# Patient Record
Sex: Female | Born: 1950
Health system: Southern US, Community
[De-identification: ages and names within clinical notes are randomized; demographics above are authoritative.]

## PROBLEM LIST (undated history)

## (undated) DIAGNOSIS — I10 Essential (primary) hypertension: Secondary | ICD-10-CM

## (undated) DIAGNOSIS — R600 Localized edema: Secondary | ICD-10-CM

## (undated) DIAGNOSIS — C801 Malignant (primary) neoplasm, unspecified: Secondary | ICD-10-CM

## (undated) DIAGNOSIS — C541 Malignant neoplasm of endometrium: Secondary | ICD-10-CM

## (undated) DIAGNOSIS — Z8679 Personal history of other diseases of the circulatory system: Secondary | ICD-10-CM

## (undated) DIAGNOSIS — E039 Hypothyroidism, unspecified: Secondary | ICD-10-CM

## (undated) DIAGNOSIS — E7800 Pure hypercholesterolemia, unspecified: Secondary | ICD-10-CM

## (undated) DIAGNOSIS — E032 Hypothyroidism due to medicaments and other exogenous substances: Secondary | ICD-10-CM

## (undated) DIAGNOSIS — R002 Palpitations: Secondary | ICD-10-CM

## (undated) DIAGNOSIS — E119 Type 2 diabetes mellitus without complications: Secondary | ICD-10-CM

## (undated) DIAGNOSIS — E559 Vitamin D deficiency, unspecified: Secondary | ICD-10-CM

## (undated) DIAGNOSIS — E785 Hyperlipidemia, unspecified: Secondary | ICD-10-CM

## (undated) HISTORY — DX: Type 2 diabetes mellitus without complications: E11.9

## (undated) HISTORY — DX: Hyperlipidemia, unspecified: E78.5

## (undated) HISTORY — DX: Hypothyroidism, unspecified: E03.9

## (undated) HISTORY — DX: Malignant (primary) neoplasm, unspecified: C80.1

## (undated) HISTORY — DX: Essential (primary) hypertension: I10

## (undated) HISTORY — DX: Malignant neoplasm of endometrium: C54.1

## (undated) HISTORY — DX: Hypothyroidism due to medicaments and other exogenous substances: E03.2

## (undated) HISTORY — DX: Localized edema: R60.0

## (undated) HISTORY — DX: Palpitations: R00.2

## (undated) HISTORY — DX: Pure hypercholesterolemia, unspecified: E78.00

## (undated) HISTORY — DX: Vitamin D deficiency, unspecified: E55.9

## (undated) HISTORY — DX: Personal history of other diseases of the circulatory system: Z86.79

---

## 1987-01-31 HISTORY — PX: TUBAL LIGATION: SHX77

## 1997-07-21 ENCOUNTER — Ambulatory Visit (HOSPITAL_COMMUNITY): Admission: RE | Admit: 1997-07-21 | Discharge: 1997-07-21 | Payer: Self-pay | Admitting: Obstetrics and Gynecology

## 1998-07-26 ENCOUNTER — Ambulatory Visit (HOSPITAL_COMMUNITY): Admission: RE | Admit: 1998-07-26 | Discharge: 1998-07-26 | Payer: Self-pay | Admitting: Obstetrics and Gynecology

## 1999-01-31 HISTORY — PX: CARDIAC CATHETERIZATION: SHX172

## 1999-02-18 ENCOUNTER — Other Ambulatory Visit: Admission: RE | Admit: 1999-02-18 | Discharge: 1999-02-18 | Payer: Self-pay | Admitting: Obstetrics and Gynecology

## 1999-04-11 ENCOUNTER — Ambulatory Visit (HOSPITAL_COMMUNITY): Admission: RE | Admit: 1999-04-11 | Discharge: 1999-04-11 | Payer: Self-pay | Admitting: Cardiology

## 1999-08-18 ENCOUNTER — Other Ambulatory Visit: Admission: RE | Admit: 1999-08-18 | Discharge: 1999-08-18 | Payer: Self-pay | Admitting: Obstetrics and Gynecology

## 1999-11-03 ENCOUNTER — Ambulatory Visit (HOSPITAL_COMMUNITY): Admission: RE | Admit: 1999-11-03 | Discharge: 1999-11-03 | Payer: Self-pay | Admitting: Obstetrics and Gynecology

## 1999-11-03 ENCOUNTER — Encounter: Payer: Self-pay | Admitting: Obstetrics and Gynecology

## 2000-02-07 ENCOUNTER — Other Ambulatory Visit: Admission: RE | Admit: 2000-02-07 | Discharge: 2000-02-07 | Payer: Self-pay | Admitting: Obstetrics and Gynecology

## 2000-10-09 ENCOUNTER — Encounter: Payer: Self-pay | Admitting: *Deleted

## 2000-10-12 ENCOUNTER — Observation Stay (HOSPITAL_COMMUNITY): Admission: RE | Admit: 2000-10-12 | Discharge: 2000-10-13 | Payer: Self-pay | Admitting: *Deleted

## 2000-10-12 ENCOUNTER — Encounter (INDEPENDENT_AMBULATORY_CARE_PROVIDER_SITE_OTHER): Payer: Self-pay | Admitting: Specialist

## 2000-10-12 ENCOUNTER — Encounter: Payer: Self-pay | Admitting: *Deleted

## 2000-12-17 ENCOUNTER — Ambulatory Visit (HOSPITAL_COMMUNITY): Admission: RE | Admit: 2000-12-17 | Discharge: 2000-12-17 | Payer: Self-pay | Admitting: Obstetrics and Gynecology

## 2000-12-17 ENCOUNTER — Encounter: Payer: Self-pay | Admitting: Obstetrics and Gynecology

## 2001-01-30 HISTORY — PX: CHOLECYSTECTOMY: SHX55

## 2001-02-11 ENCOUNTER — Other Ambulatory Visit: Admission: RE | Admit: 2001-02-11 | Discharge: 2001-02-11 | Payer: Self-pay | Admitting: Obstetrics and Gynecology

## 2001-12-24 ENCOUNTER — Encounter: Payer: Self-pay | Admitting: Obstetrics and Gynecology

## 2001-12-24 ENCOUNTER — Ambulatory Visit (HOSPITAL_COMMUNITY): Admission: RE | Admit: 2001-12-24 | Discharge: 2001-12-24 | Payer: Self-pay | Admitting: Obstetrics and Gynecology

## 2002-04-01 ENCOUNTER — Other Ambulatory Visit: Admission: RE | Admit: 2002-04-01 | Discharge: 2002-04-01 | Payer: Self-pay | Admitting: Obstetrics and Gynecology

## 2005-04-30 HISTORY — PX: OTHER SURGICAL HISTORY: SHX169

## 2005-10-30 LAB — CONVERTED CEMR LAB

## 2006-01-18 ENCOUNTER — Ambulatory Visit: Payer: Self-pay | Admitting: Family Medicine

## 2006-01-18 DIAGNOSIS — Z6835 Body mass index (BMI) 35.0-35.9, adult: Secondary | ICD-10-CM | POA: Insufficient documentation

## 2006-01-18 DIAGNOSIS — Z8679 Personal history of other diseases of the circulatory system: Secondary | ICD-10-CM | POA: Insufficient documentation

## 2006-01-18 DIAGNOSIS — E559 Vitamin D deficiency, unspecified: Secondary | ICD-10-CM

## 2006-01-18 DIAGNOSIS — N926 Irregular menstruation, unspecified: Secondary | ICD-10-CM

## 2006-01-18 DIAGNOSIS — E032 Hypothyroidism due to medicaments and other exogenous substances: Secondary | ICD-10-CM

## 2006-01-18 DIAGNOSIS — E66811 Obesity, class 1: Secondary | ICD-10-CM | POA: Insufficient documentation

## 2006-01-18 DIAGNOSIS — E669 Obesity, unspecified: Secondary | ICD-10-CM

## 2006-01-18 DIAGNOSIS — E7800 Pure hypercholesterolemia, unspecified: Secondary | ICD-10-CM

## 2006-01-18 DIAGNOSIS — Z6834 Body mass index (BMI) 34.0-34.9, adult: Secondary | ICD-10-CM | POA: Insufficient documentation

## 2006-01-18 HISTORY — DX: Hypothyroidism due to medicaments and other exogenous substances: E03.2

## 2006-01-18 HISTORY — DX: Vitamin D deficiency, unspecified: E55.9

## 2006-01-18 HISTORY — DX: Pure hypercholesterolemia, unspecified: E78.00

## 2006-01-18 HISTORY — DX: Personal history of other diseases of the circulatory system: Z86.79

## 2006-02-21 ENCOUNTER — Encounter: Payer: Self-pay | Admitting: Family Medicine

## 2006-02-21 ENCOUNTER — Telehealth: Payer: Self-pay | Admitting: Family Medicine

## 2006-02-21 ENCOUNTER — Telehealth (INDEPENDENT_AMBULATORY_CARE_PROVIDER_SITE_OTHER): Payer: Self-pay | Admitting: *Deleted

## 2006-02-27 ENCOUNTER — Encounter: Payer: Self-pay | Admitting: Family Medicine

## 2006-02-27 ENCOUNTER — Telehealth (INDEPENDENT_AMBULATORY_CARE_PROVIDER_SITE_OTHER): Payer: Self-pay | Admitting: *Deleted

## 2006-03-07 ENCOUNTER — Ambulatory Visit: Payer: Self-pay | Admitting: Family Medicine

## 2006-03-07 DIAGNOSIS — I10 Essential (primary) hypertension: Secondary | ICD-10-CM

## 2006-05-10 ENCOUNTER — Telehealth: Payer: Self-pay | Admitting: Family Medicine

## 2006-06-06 ENCOUNTER — Ambulatory Visit: Payer: Self-pay | Admitting: Family Medicine

## 2006-06-12 ENCOUNTER — Encounter: Payer: Self-pay | Admitting: Family Medicine

## 2006-06-14 ENCOUNTER — Encounter: Payer: Self-pay | Admitting: Family Medicine

## 2006-06-20 LAB — CONVERTED CEMR LAB
TSH: 3.403 microintl units/mL (ref 0.350–5.50)
Vit D, 1,25-Dihydroxy: 24 (ref 20–57)

## 2006-06-29 ENCOUNTER — Telehealth: Payer: Self-pay | Admitting: Family Medicine

## 2006-08-07 ENCOUNTER — Telehealth: Payer: Self-pay | Admitting: Family Medicine

## 2007-01-01 ENCOUNTER — Ambulatory Visit: Payer: Self-pay | Admitting: Family Medicine

## 2007-01-02 LAB — CONVERTED CEMR LAB
ALT: 16 units/L (ref 0–35)
AST: 18 units/L (ref 0–37)
Albumin: 4.3 g/dL (ref 3.5–5.2)
Alkaline Phosphatase: 84 units/L (ref 39–117)
BUN: 14 mg/dL (ref 6–23)
CO2: 25 meq/L (ref 19–32)
Calcium: 9.4 mg/dL (ref 8.4–10.5)
Chloride: 103 meq/L (ref 96–112)
Cholesterol: 166 mg/dL (ref 0–200)
Creatinine, Ser: 0.65 mg/dL (ref 0.40–1.20)
Glucose, Bld: 111 mg/dL — ABNORMAL HIGH (ref 70–99)
HDL: 41 mg/dL (ref >39)
LDL Cholesterol: 94 mg/dL (ref 0–99)
Potassium: 4.6 meq/L (ref 3.5–5.3)
Sodium: 141 meq/L (ref 135–145)
TSH: 0.412 microintl units/mL (ref 0.350–5.50)
Total Bilirubin: 0.6 mg/dL (ref 0.3–1.2)
Total CHOL/HDL Ratio: 4
Total Protein: 7.4 g/dL (ref 6.0–8.3)
Triglycerides: 155 mg/dL — ABNORMAL HIGH (ref <150)
VLDL: 31 mg/dL (ref 0–40)
Vit D, 1,25-Dihydroxy: 32 (ref 30–89)

## 2007-05-28 ENCOUNTER — Other Ambulatory Visit: Admission: RE | Admit: 2007-05-28 | Discharge: 2007-05-28 | Payer: Self-pay | Admitting: Family Medicine

## 2007-05-28 ENCOUNTER — Encounter: Payer: Self-pay | Admitting: Family Medicine

## 2007-05-28 ENCOUNTER — Ambulatory Visit: Payer: Self-pay | Admitting: Family Medicine

## 2007-05-28 DIAGNOSIS — R7301 Impaired fasting glucose: Secondary | ICD-10-CM | POA: Insufficient documentation

## 2007-05-28 LAB — CONVERTED CEMR LAB
Glucose, Bld: 131 mg/dL
Hgb A1c MFr Bld: 5.8 %
Vit D, 1,25-Dihydroxy: 44 (ref 30–89)

## 2007-05-31 LAB — CONVERTED CEMR LAB: Pap Smear: NORMAL

## 2007-07-03 ENCOUNTER — Encounter: Payer: Self-pay | Admitting: Family Medicine

## 2008-02-25 ENCOUNTER — Telehealth: Payer: Self-pay | Admitting: Family Medicine

## 2008-06-25 ENCOUNTER — Ambulatory Visit: Payer: Self-pay | Admitting: Family Medicine

## 2008-09-17 ENCOUNTER — Encounter (INDEPENDENT_AMBULATORY_CARE_PROVIDER_SITE_OTHER): Payer: Self-pay | Admitting: *Deleted

## 2008-12-21 ENCOUNTER — Encounter: Payer: Self-pay | Admitting: Family Medicine

## 2009-01-07 ENCOUNTER — Ambulatory Visit: Payer: Self-pay | Admitting: Family Medicine

## 2009-01-07 DIAGNOSIS — J019 Acute sinusitis, unspecified: Secondary | ICD-10-CM

## 2009-01-15 ENCOUNTER — Telehealth: Payer: Self-pay | Admitting: Family Medicine

## 2009-09-06 ENCOUNTER — Telehealth: Payer: Self-pay | Admitting: Family Medicine

## 2009-09-16 ENCOUNTER — Ambulatory Visit: Payer: Self-pay | Admitting: Family Medicine

## 2009-09-16 ENCOUNTER — Encounter (INDEPENDENT_AMBULATORY_CARE_PROVIDER_SITE_OTHER): Payer: Self-pay | Admitting: *Deleted

## 2009-09-16 DIAGNOSIS — R002 Palpitations: Secondary | ICD-10-CM

## 2009-09-16 DIAGNOSIS — M25569 Pain in unspecified knee: Secondary | ICD-10-CM

## 2009-09-16 HISTORY — DX: Palpitations: R00.2

## 2009-10-08 ENCOUNTER — Encounter: Payer: Self-pay | Admitting: Family Medicine

## 2009-10-15 ENCOUNTER — Encounter: Payer: Self-pay | Admitting: Family Medicine

## 2009-10-18 ENCOUNTER — Telehealth (INDEPENDENT_AMBULATORY_CARE_PROVIDER_SITE_OTHER): Payer: Self-pay | Admitting: *Deleted

## 2009-11-04 ENCOUNTER — Encounter: Payer: Self-pay | Admitting: Family Medicine

## 2010-02-18 ENCOUNTER — Encounter: Payer: Self-pay | Admitting: Family Medicine

## 2010-02-18 ENCOUNTER — Telehealth (INDEPENDENT_AMBULATORY_CARE_PROVIDER_SITE_OTHER): Payer: Self-pay | Admitting: *Deleted

## 2010-02-18 LAB — CONVERTED CEMR LAB
Cholesterol: 179 mg/dL
LDL Cholesterol: 104 mg/dL
Triglyceride fasting, serum: 125 mg/dL

## 2010-02-21 ENCOUNTER — Encounter: Payer: Self-pay | Admitting: Family Medicine

## 2010-03-01 NOTE — Progress Notes (Signed)
----   Converted from flag ---- ---- 10/15/2009 5:17 PM, Nani Gasser MD wrote: Reviewed labs from  last year that were faxed over. They look great. No cholestesrol was checked adn the last one we checked was in 2008 so very overdue. Let recheck if OK. Can fax lab slip. ------------------------------ 10/18/2009- Pt notified of above. kJ LPN

## 2010-03-01 NOTE — Procedures (Signed)
Summary: [facility]   Imported By: Lanelle Bal 11/17/2009 14:27:09  _____________________________________________________________________  External Attachment:    Type:   Image     Comment:   External Document

## 2010-03-01 NOTE — Progress Notes (Signed)
Summary: refill on meds  Phone Note Call from Patient   Caller: Spouse Summary of Call: Dr.Jacelyn Cuen ***FYI****     Patient has an appt on 8-18 and she want to know if she can get a refill on her meds. Initial call taken by: Vanessa Swaziland,  September 06, 2009 3:49 PM  Follow-up for Phone Call        Already refilled the HCTZ. Pt notified Follow-up by: Kathlene November,  September 06, 2009 4:26 PM

## 2010-03-01 NOTE — Consult Note (Signed)
Summary: [facility]  [facility]   Imported By: Lanelle Bal 11/19/2009 15:32:23  _____________________________________________________________________  External Attachment:    Type:   Image     Comment:   External Document

## 2010-03-01 NOTE — Letter (Signed)
Summary: [facility]  [facility]   Imported By: Lanelle Bal 12/27/2009 08:54:34  _____________________________________________________________________  External Attachment:    Type:   Image     Comment:   External Document

## 2010-03-01 NOTE — Assessment & Plan Note (Signed)
Summary: HTN, palps, knee pain   Vital Signs:  Patient profile:   60 year old female Height:      67 inches Weight:      239 pounds Pulse rate:   61 / minute BP sitting:   116 / 67  (left arm) Cuff size:   large  Vitals Entered By: Avon Gully CMA, Duncan Dull) (September 16, 2009 10:57 AM) CC: f/u BP and refill meds,rt knee pain x several weeks   Primary Care Provider:  Linford Arnold, C  CC:  f/u BP and refill meds and rt knee pain x several weeks.  History of Present Illness: f/u BP and refill meds,rt knee pain x several weeks.  Right medial knee pain.  Pinaful with lifting leg and turning her leg. Hard to put he sock on.  Is up and down from her chair at her job. Burning stinging sensation. IBU does help some. STarted 3 weeks ago. No swelling. Not panful with walking.  No trauma.  No other ice or heat treatments.   Palpatation are more frequent. Occ intermittant nausea for minutes nda then resolve.  Has occ episodes of lightheadedness for a few seconds.  Last cardiac cath in 2001 that was normal. 30% blockage of the right coronary artery. Has never followed up with cardiology or had a stress test since them. She says the palps are not necessarily new but have become much more frequent. Teh nausea episdoes are new but not associ in timing with the palps.   Current Medications (verified): 1)  Levothroid 175 Mcg Tabs (Levothyroxine Sodium) .... Take 1 Tablet By Mouth Once A Day 2)  Hydrochlorothiazide 25 Mg Tabs (Hydrochlorothiazide) .... Take 1 Tablet By Mouth Once A Day 3)  Simvastatin 40 Mg Tabs (Simvastatin) .... Take 1 Tablet By Mouth Once A Day 4)  Toprol Xl 50 Mg Tb24 (Metoprolol Succinate) .... Take 1 Tablet By Mouth Once A Day 5)  Ecotrin Low Strength 81 Mg Tbec (Aspirin) .... Take 1 Tablet By Mouth Once A Day 6)  Augmentin 875-125 Mg Tabs (Amoxicillin-Pot Clavulanate) .... Take 1 Tablet By Mouth Two Times A Day For 10 Days. 7)  Fluconazole 150 Mg Tabs (Fluconazole) .Marland Kitchen.. 1 Tab By  Mouth X 1 Day  Allergies (verified): No Known Drug Allergies  Comments:  Nurse/Medical Assistant: The patient's medications and allergies were reviewed with the patient and were updated in the Medication and Allergy Lists. Avon Gully CMA, Duncan Dull) (September 16, 2009 10:58 AM)  Physical Exam  General:  Well-developed,well-nourished,in no acute distress; alert,appropriate and cooperative throughout examination Lungs:  Normal respiratory effort, chest expands symmetrically. Lungs are clear to auscultation, no crackles or wheezes. Heart:  RRR with 2/6 SEM better heard on on the right side of the chest. N carotid bruits.  Msk:  Right knee with no apparent swelling. Tender along the medial joint line/ No laxity of joint. Normal ROM. Neg Mcmurrays.  hip, knee, and ankle strength is 5/5.  Pulses:  Radial 2+    Impression & Recommendations:  Problem # 1:  HYPERTENSION, BENIGN ESSENTIAL (ICD-401.1) Looks great today. Continue current regimen.  Her updated medication list for this problem includes:    Hydrochlorothiazide 25 Mg Tabs (Hydrochlorothiazide) .Marland Kitchen... Take 1 tablet by mouth once a day    Toprol Xl 50 Mg Tb24 (Metoprolol succinate) .Marland Kitchen... Take 1 tablet by mouth once a day  BP today: 116/67 Prior BP: 141/71 (01/07/2009)  Prior 10 Yr Risk Heart Disease: 6 % (06/25/2008)  Labs Reviewed: K+: 4.6 (01/01/2007)  Creat: : 0.65 (01/01/2007)   Chol: 166 (01/01/2007)   HDL: 41 (01/01/2007)   LDL: 94 (01/01/2007)   TG: 155 (01/01/2007)  Problem # 2:  PALPITATIONS (ICD-785.1) Since palsp are more frequent and had some mild blockage 10 years ago will refer back to cards for further evaluation Pt prefer [facility] for insurance reasons.  Prefer [facility].  Her updated medication list for this problem includes:    Toprol Xl 50 Mg Tb24 (Metoprolol succinate) .Marland Kitchen... Take 1 tablet by mouth once a day  Orders: Cardiology Referral (Cardiology)  Problem # 3:  KNEE PAIN, RIGHT (EAV-409.81) Assessment:  New No trauma injury so fracture not likely.  REc change to Tylenol since has had gastric surgery. Elevate and rest. If nto better in 1-2 weeks willl get an xray and consider further workup. Consider meniscal tear vs medial compartment OA.  Her updated medication list for this problem includes:    Ecotrin Low Strength 81 Mg Tbec (Aspirin) .Marland Kitchen... Take 1 tablet by mouth once a day  Complete Medication List: 1)  Levothroid 175 Mcg Tabs (Levothyroxine sodium) .... Take 1 tablet by mouth once a day 2)  Hydrochlorothiazide 25 Mg Tabs (Hydrochlorothiazide) .... Take 1 tablet by mouth once a day 3)  Simvastatin 40 Mg Tabs (Simvastatin) .... Take 1 tablet by mouth once a day 4)  Toprol Xl 50 Mg Tb24 (Metoprolol succinate) .... Take 1 tablet by mouth once a day 5)  Ecotrin Low Strength 81 Mg Tbec (Aspirin) .... Take 1 tablet by mouth once a day 6)  Fish Oil 1000 Mg Caps (Omega-3 fatty acids) .... Take 1 tablet by mouth once a day  Other Orders: T-Lipid Profile ([id number])  Patient Instructions: 1)  We will call you with the cardiology appointment. 2)  Please schedule a follow-up appointment in 6 months .  Prescriptions: HYDROCHLOROTHIAZIDE 25 MG TABS (HYDROCHLOROTHIAZIDE) Take 1 tablet by mouth once a day  #90 x 3   Entered and Authorized by:   Nani Gasser MD   Signed by:   Nani Gasser MD on 09/16/2009   Method used:   Print then Give to Patient   RxID:   [id number]

## 2010-03-01 NOTE — Letter (Signed)
Summary: Primary Care Consult Scheduled Letter  [facility]  [address]   [place], [place] [address]   Phone: [phone]  Fax: [phone]      09/16/2009 MRN: [id number]  [facility] [address] [place], [place]  [address]    Dear [name],    We have scheduled an appointment for you.  At the recommendation of Dr.Metheney, we have scheduled you a consult with  [facility] on 10/06/09 at 1:15.  Their address is [address] [place], [place]. The office phone number is [phone].  If this appointment day and time is not convenient for you, please feel free to call the office of the doctor you are being referred to at the number listed above and reschedule the appointment.     It is important for you to keep your scheduled appointments. We are here to make sure you are given good patient care.    Thank you, Penny Bauer, [phone] Patient Care Coordinator [facility]

## 2010-03-03 NOTE — Progress Notes (Addendum)
Summary: Lab order  Phone Note Call from Patient Call back at Home Phone [phone]   Summary of Call: pt would like order for TSH faxed to [facility] in [place].  Pt states Dr. Linford Arnold gave her an order previously, but she lost it.  Pt will be going on Monday to have other routine labs (lipid) drawn. Initial call taken by: Francee Piccolo CMA Duncan Dull),  February 18, 2010 3:36 PM  Follow-up for Phone Call        Called and added to lab Follow-up by: Payton Spark CMA,  February 21, 2010 9:17 AM

## 2010-03-03 NOTE — Miscellaneous (Signed)
Summary: lipids  Clinical Lists Changes  Observations: Added new observation of TRIGLYCERIDE: 125 mg/dL (40/10/2723 3:66) Added new observation of HDL: 50 mg/dL (44/03/4740 5:95) Added new observation of LDL: 104 mg/dL (63/87/5643 3:29) Added new observation of CHOLESTEROL: 179 mg/dL (51/88/4166 0:63)   Call pt: LDL up some from last time. LDL is 104. Goal < 100. Work on diet, exercise to control this or we can increase the simvastatin but she is so close I think diet and exercise will breing it down 5 points Recheck direct LDL in 6 months. January 23, 20129:36 AM Ytzel Gubler MD, [facility]  10:58 AM February 21, 2010 McCrimmon CMA, Duncan Dull), Sue Lush pt's spouse notified     Lipid Panel Test Date: 02/18/2010                        Value        Units        H/L   Reference  Cholesterol:          179          mg/dL              (016-010) LDL Cholesterol:      104          mg/dL              (93-235) HDL Cholesterol:      50           mg/dL              (57-32) Triglyceride:         125          mg/dL              (20-254)

## 2010-03-04 ENCOUNTER — Telehealth: Payer: Self-pay | Admitting: Family Medicine

## 2010-03-09 ENCOUNTER — Encounter: Payer: Self-pay | Admitting: Family Medicine

## 2010-03-09 NOTE — Progress Notes (Signed)
Summary: increased thyroid dose  Phone Note Call from Patient   Summary of Call: TSH was TSH 23.400 on [facility] labs on 02-18-10.  Will increase her dose of thyroid medication and recheck TSH after 8 wks. Initial call taken by: Seymour Bars DO,  March 04, 2010 10:52 AM  Follow-up for Phone Call        pt notified of above.  She is agreeable.  Verbal script given to Onalee Hua at healthcare pharmacy.  Advised him to d/c script for 175 mcg Follow-up by: Francee Piccolo CMA Duncan Dull),  March 04, 2010 3:37 PM    New/Updated Medications: LEVOTHROID 200 MCG TABS (LEVOTHYROXINE SODIUM) 1 tab by mouth daily Prescriptions: LEVOTHROID 200 MCG TABS (LEVOTHYROXINE SODIUM) 1 tab by mouth daily  #30 x 1   Entered and Authorized by:   Seymour Bars DO   Signed by:   Seymour Bars DO on 03/04/2010   Method used:   Print then Give to Patient   RxID:   [id number]

## 2010-03-24 ENCOUNTER — Encounter: Payer: Self-pay | Admitting: Family Medicine

## 2010-03-29 ENCOUNTER — Encounter: Payer: Self-pay | Admitting: Family Medicine

## 2010-03-30 ENCOUNTER — Encounter: Payer: Self-pay | Admitting: Family Medicine

## 2010-04-07 NOTE — Procedures (Signed)
Summary: Colonoscopy    Preventive Care Screening  Colonoscopy:    Date:  03/29/2010    Next Due:  03/2015    Results:  abnormal

## 2010-04-18 ENCOUNTER — Telehealth: Payer: Self-pay | Admitting: Family Medicine

## 2010-04-19 NOTE — Letter (Signed)
Summary: Pathology Results Letter/[facility]  Pathology Results Letter/[facility]   Imported By: Maryln Gottron 04/12/2010 10:04:30  _____________________________________________________________________  External Attachment:    Type:   Image     Comment:   External Document

## 2010-04-19 NOTE — Procedures (Signed)
Summary: Colonoscopy Report/[facility]  Colonoscopy Report/[facility]   Imported By: Maryln Gottron 04/13/2010 09:52:04  _____________________________________________________________________  External Attachment:    Type:   Image     Comment:   External Document

## 2010-04-28 NOTE — Progress Notes (Signed)
  Phone Note Call from Patient   Caller: Patient Summary of Call: pt needs a lab order sent to her home address for TSH Initial call taken by: Avon Gully CMA, Duncan Dull),  April 18, 2010 9:22 AM

## 2010-05-19 ENCOUNTER — Telehealth: Payer: Self-pay | Admitting: *Deleted

## 2010-05-19 DIAGNOSIS — E039 Hypothyroidism, unspecified: Secondary | ICD-10-CM

## 2010-05-19 NOTE — Telephone Encounter (Signed)
Lab order and mailed to home

## 2010-05-24 LAB — TSH: TSH: 0.401

## 2010-06-08 ENCOUNTER — Encounter: Payer: Self-pay | Admitting: Family Medicine

## 2010-06-08 ENCOUNTER — Telehealth: Payer: Self-pay | Admitting: Family Medicine

## 2010-06-08 DIAGNOSIS — E039 Hypothyroidism, unspecified: Secondary | ICD-10-CM

## 2010-06-08 NOTE — Telephone Encounter (Signed)
Pt notified. Pt request that we mail her a lab order because she gets labs drawn in [place] [facility]

## 2010-06-08 NOTE — Telephone Encounter (Signed)
Call patient: TSH is actually a little too low on lab drawn at [facility]. I recommended she take a half of a tab 2 days a week. The other 5 days a week she can take a whole 200 mcg tab. Let's recheck the level in approximately 6 weeks on this new regimen.

## 2010-06-17 NOTE — Cardiovascular Report (Signed)
Penny Bauer. [facility]  Patient:    Penny Bauer, Penny Bauer                       MRN: [id number] Proc. Date: 04/11/99 Adm. Date:  [id number] Attending:  Corliss Marcus CC:         Marinda Elk, M.D.             Cardiac Catheterization Lab                        Cardiac Catheterization  CINE NO. [id number]  INDICATIONS:  This is a 60 year old woman with atypical angina and a nondiagnostic Cardiolite image.  She is brought to the catheterization laboratory to evaluate for possible CAD.  She has an exercise-induced tachy arrhythmia felt to represent atrial fibrillation and has been placed on beta blocker for this.  She is to undergo coronary angiography to rule out the possibility of CAD.  PROCEDURAL NOTE:  Left heart catheterization were performed following the percutaneous insertion of a 6-French catheter sheath utilizing an anterior approach over a guiding J wire.  The right groin had previously been prepped and draped n the usual sterile fashion.  Local anesthesia was obtained with the infiltration of 1% lidocaine.  A 5-French 110-cm pigtail catheter was advanced to the ascending  aorta where the pressure was recorded.  The catheter was then prolapsed across he aortic valve and the pressure was again recorded in the left ventricle both prior to and following the ventriculogram.  A 30-degree RAO cine left ventriculogram as performed using a power injector.  Nonionic contrast material of 45 cc was injected at 13 cc/second.  Right and left coronary angiography was then performed using right and left 5-French #4 Judkins catheters.  Cineangiography of each coronary  artery was conducted in LAO and RAO projections.  There was an ostial lesion noted in the right coronary, and the patient received 0.15 mg of intracoronary nitroglycerin and a subsequent LAO angiogram was repeated.  The patient did receive 1500 units of heparin following insertion of the arterial  sheath and 0.4 mg of nitroglycerin sublingual prior to initiation of the coronary angiography.  At the completion of the procedure, the catheter and catheter sheath were removed. Hemostasis was achieved by direct pressure.  The patient was transported to the  recovery area in stable condition with intact distal pulses.  Fluoroscopy time as 4.5 minutes.  Total contrast utilized was 200 cc of Omnipaque.  HEMODYNAMIC DATA:  Systemic arterial pressure was 168/96 with a mean of 117 mmHg. There was no systolic gradient across the aortic valve.  The left ventricular end diastolic pressure was 19 mmHg pre ventriculogram and unchanged post ventriculogram.  ANGIOGRAPHIC DATA:  The left ventriculogram demonstrated normal chamber size and overall normal global systolic function.  Unfortunately, ventricular tachycardia was produced throughout injection; therefore, no comment can be made about mitral regurgitation.  No coronary calcification is seen.  An estimate of the ejection fraction could not be made other than to say that it is normal.  There is a left dominant coronary system present. 1. The main left coronary artery is normal. 2. The left anterior descending artery and its branches have some luminal    irregularities in the proximal segment but not greater than 10-15% obstruction    of the lumen.  The ongoing vessel is large, transapical, and minimally diseased.    There are two moderate sized diagonal branches which arise  and are free of    significant obstruction. 3. The left circumflex artery and its branches are normal.  A very small first    marginal branch arises, then a large second marginal branch, and then two    posterolateral branches.  There are no significant obstructions in these    vessels. 4. The right coronary artery appeared to have a 30-40% ostial narrowing.  The    vessel is overall very small and gives rise to an acute marginal branch which    then proceeds  to supply some inferior septal perforators.  Following an    intracoronary injection of 0.4 nitroglycerin, the obstruction in the ostium    of the right coronary cannot be seen to be greater than 30%.  FINAL IMPRESSION: 1. Very mild nonobstructive atherosclerotic coronary vascular disease. 2. Intact left ventricular size and systolic function.  PLAN:  The patient is presented with this gratifying news.  I do not believe that her chest discomfort is secondary to obstructive coronary disease.  Because of the exercise-induced tachy arrhythmia, I will maintain her on a beta  blocker.  In fact, in the catheterization suite today, her initial central aortic pressure was elevated, and she was mildly tachycardic.  She did take her beta blocker this morning, Toprol 50 mg.  There is no clear history of hypertension.  I will repeat her exercise treadmill test for efficacy of the beta blocker in the near future.  I am increasing the dose from 50 to 100 mg of Toprol p.o. q. day. DD:  04/11/98 TD:  04/11/99 Job: [id number]

## 2010-07-01 ENCOUNTER — Other Ambulatory Visit: Payer: Self-pay | Admitting: Family Medicine

## 2010-08-04 ENCOUNTER — Ambulatory Visit (INDEPENDENT_AMBULATORY_CARE_PROVIDER_SITE_OTHER): Payer: Commercial Indemnity | Admitting: Family Medicine

## 2010-08-04 ENCOUNTER — Encounter: Payer: Self-pay | Admitting: Family Medicine

## 2010-08-04 DIAGNOSIS — E739 Lactose intolerance, unspecified: Secondary | ICD-10-CM

## 2010-08-04 DIAGNOSIS — R002 Palpitations: Secondary | ICD-10-CM

## 2010-08-04 DIAGNOSIS — R609 Edema, unspecified: Secondary | ICD-10-CM

## 2010-08-04 DIAGNOSIS — R600 Localized edema: Secondary | ICD-10-CM

## 2010-08-04 DIAGNOSIS — E032 Hypothyroidism due to medicaments and other exogenous substances: Secondary | ICD-10-CM

## 2010-08-04 DIAGNOSIS — Z23 Encounter for immunization: Secondary | ICD-10-CM

## 2010-08-04 HISTORY — DX: Localized edema: R60.0

## 2010-08-04 MED ORDER — LEVOTHYROXINE SODIUM 175 MCG PO TABS: 175.0000 ug | ORAL_TABLET | Freq: Every day | ORAL | Status: DC

## 2010-08-04 MED ORDER — HYDROCHLOROTHIAZIDE 25 MG PO TABS: 25.0000 mg | ORAL_TABLET | Freq: Every day | ORAL | Status: DC

## 2010-08-04 MED ORDER — SIMVASTATIN 40 MG PO TABS: 40.0000 mg | ORAL_TABLET | Freq: Every day | ORAL | Status: DC

## 2010-08-04 NOTE — Assessment & Plan Note (Signed)
Due for recheck glucose. CMP ordered today.

## 2010-08-04 NOTE — Assessment & Plan Note (Signed)
Well controlled on increased dose of beta blocker.

## 2010-08-04 NOTE — Assessment & Plan Note (Addendum)
CCCut dose in half since BP is borderline low, though she is asymptomatic.  Check CMP. If swelling recurs and she can go back to a whole tab.

## 2010-08-04 NOTE — Progress Notes (Signed)
  Subjective:    Patient ID: Penny Bauer, female    DOB: [birth year], 60 y.o.   MRN: [id number]  HPI  Penny Bauer to see cardiology last fall.  Saw Dr. Karren Burly.  Increased her betablocker to 100mg  and doing well on this. No more palps on the inc dose. No CP, SOB, etc.  No dizziness even thought BP borderline low today.  Complaint with meds.   Would like Thyroid dose adjusted back to to 175 as she had actually missed several doses when she was bumped to the synthroid dose and then when we rechecked her on this she was too low.   Review of Systems     Objective:   Physical Exam  Constitutional: She is oriented to person, place, and time. She appears well-developed and well-nourished.  Cardiovascular: Normal rate, regular rhythm and normal heart sounds.   Pulmonary/Chest: Effort normal and breath sounds normal.  Musculoskeletal: She exhibits no edema.  Neurological: She is alert and oriented to person, place, and time.  Skin: Skin is warm and dry.  Psychiatric: She has a normal mood and affect. Her behavior is normal.          Assessment & Plan:

## 2010-08-04 NOTE — Assessment & Plan Note (Signed)
Adjust dose back to and recheck in 2 months.

## 2010-08-04 NOTE — Assessment & Plan Note (Signed)
She doing on well on the inc dose o the toprol.

## 2010-08-04 NOTE — Patient Instructions (Signed)
Call in about 2 months to recheck thyroid.  Cut HCTZ in half if you can.

## 2010-08-05 ENCOUNTER — Other Ambulatory Visit: Payer: Self-pay | Admitting: Family Medicine

## 2011-01-19 ENCOUNTER — Telehealth: Payer: Self-pay | Admitting: Family Medicine

## 2011-01-19 NOTE — Telephone Encounter (Signed)
Please call patient. Normal mammogram.  Repeat in 1 year.  

## 2011-01-20 NOTE — Telephone Encounter (Signed)
Notified spouse  °

## 2011-01-26 ENCOUNTER — Encounter: Payer: Self-pay | Admitting: Family Medicine

## 2011-05-07 ENCOUNTER — Other Ambulatory Visit: Payer: Self-pay | Admitting: Family Medicine

## 2011-05-17 ENCOUNTER — Other Ambulatory Visit: Payer: Self-pay | Admitting: Family Medicine

## 2011-05-17 MED ORDER — METOPROLOL SUCCINATE ER 100 MG PO TB24: 100.0000 mg | ORAL_TABLET | Freq: Every day | ORAL | Status: DC

## 2011-06-16 ENCOUNTER — Encounter: Payer: Self-pay | Admitting: *Deleted

## 2011-06-19 ENCOUNTER — Encounter: Payer: Self-pay | Admitting: Family Medicine

## 2011-06-19 ENCOUNTER — Other Ambulatory Visit (HOSPITAL_COMMUNITY)
Admission: RE | Admit: 2011-06-19 | Discharge: 2011-06-19 | Disposition: A | Discharge status: Home / Self Care | Payer: BC Managed Care – PPO | Source: Ambulatory Visit | Attending: Family Medicine | Admitting: Family Medicine

## 2011-06-19 ENCOUNTER — Ambulatory Visit (INDEPENDENT_AMBULATORY_CARE_PROVIDER_SITE_OTHER): Payer: BC Managed Care – PPO | Admitting: Family Medicine

## 2011-06-19 VITALS — BP 119/68 | HR 76 | Ht 68.0 in | Wt 236.0 lb

## 2011-06-19 DIAGNOSIS — Z01419 Encounter for gynecological examination (general) (routine) without abnormal findings: Secondary | ICD-10-CM | POA: Insufficient documentation

## 2011-06-19 DIAGNOSIS — E039 Hypothyroidism, unspecified: Secondary | ICD-10-CM

## 2011-06-19 DIAGNOSIS — Z1159 Encounter for screening for other viral diseases: Secondary | ICD-10-CM | POA: Insufficient documentation

## 2011-06-19 MED ORDER — HYDROCHLOROTHIAZIDE 25 MG PO TABS: 25.0000 mg | ORAL_TABLET | Freq: Every day | ORAL | Status: DC

## 2011-06-19 MED ORDER — LEVOTHYROXINE SODIUM 175 MCG PO TABS: 175.0000 ug | ORAL_TABLET | Freq: Every day | ORAL | Status: DC

## 2011-06-19 MED ORDER — SIMVASTATIN 40 MG PO TABS: 40.0000 mg | ORAL_TABLET | Freq: Every day | ORAL | Status: DC

## 2011-06-19 MED ORDER — METOPROLOL SUCCINATE ER 100 MG PO TB24: 100.0000 mg | ORAL_TABLET | Freq: Every day | ORAL | Status: DC

## 2011-06-19 NOTE — Patient Instructions (Signed)
Start a regular exercise program and make sure you are eating a healthy diet Try to eat 4 servings of dairy a day  Your vaccines are up to date.   

## 2011-06-19 NOTE — Progress Notes (Signed)
  Subjective:     Penny Bauer is a 61 y.o. female and is here for a comprehensive physical exam. The patient reports no problems.  History   Social History  . Marital Status: Married    Spouse Name: Trey Paula    Number of Children: 3  . Years of Education: N/A   Occupational History  . RN     [employer]    Social History Main Topics  . Smoking status: Never Smoker   . Smokeless tobacco: Not on file  . Alcohol Use: No  . Drug Use: No  . Sexually Active: Not on file   Other Topics Concern  . Not on file   Social History Narrative   Her daughers are Raphael Gibney and Lurena Joiner Huminski. No regular exercise   Health Maintenance  Topic Date Due  . Pap Smear  05/14/1966  . Influenza Vaccine  10/31/2011  . Mammogram  01/16/2013  . Colonoscopy  03/30/2015  . Tetanus/tdap  10/01/2015  . Zostavax  Completed    The following portions of the patient's history were reviewed and updated as appropriate: allergies, current medications, past family history, past medical history, past social history, past surgical history and problem list.  Review of Systems A comprehensive review of systems was negative.   Objective:    BP 119/68  Pulse 76  Ht 5\' 8"  (1.727 m)  Wt 236 lb (107.049 kg)  BMI 35.88 kg/m2 General appearance: alert, cooperative, appears stated age and mild distress Head: Normocephalic, without obvious abnormality, atraumatic Eyes: conj clear, EOMi, PEERLA Ears: normal TM's and external ear canals both ears Nose: Nares normal. Septum midline. Mucosa normal. No drainage or sinus tenderness. Throat: lips, mucosa, and tongue normal; teeth and gums normal Neck: no adenopathy, no carotid bruit, no JVD, supple, symmetrical, trachea midline and thyroid not enlarged, symmetric, no tenderness/mass/nodules Back: symmetric, no curvature. ROM normal. No CVA tenderness. Lungs: clear to auscultation bilaterally Breasts: normal appearance, no masses or tenderness Heart:  regular rate and rhythm, S1, S2 normal, no murmur, click, rub or gallop Abdomen: soft, non-tender; bowel sounds normal; no masses,  no organomegaly Pelvic: cervix normal in appearance, external genitalia normal, no adnexal masses or tenderness, no cervical motion tenderness, rectovaginal septum normal, uterus normal size, shape, and consistency and vagina normal without discharge Extremities: extremities normal, atraumatic, no cyanosis or edema Pulses: 2+ and symmetric Skin: Skin color, texture, turgor normal. No rashes or lesions Lymph nodes: Cervical, supraclavicular, and axillary nodes normal. Neurologic: Grossly normal    Assessment:    Healthy female exam.      Plan:     See After Visit Summary for Counseling Recommendations  Start a regular exercise program and make sure you are eating a healthy diet Try to eat 4 servings of dairy a day or take a calcium supplement (500mg  twice a day). Your vaccines are up to date.  Due for screening labs.   Hypothyroid - recheck TSH today. RF sent to pharm.   BP - well controlled. RF sent to pharm  Lipids- Due to recheck. RF sent to pharm.

## 2011-06-30 ENCOUNTER — Other Ambulatory Visit: Payer: Self-pay | Admitting: Family Medicine

## 2011-08-08 ENCOUNTER — Encounter: Payer: Commercial Indemnity | Admitting: Family Medicine

## 2012-01-31 HISTORY — PX: ROBOTIC ASSISTED LAPAROSCOPIC HYSTERECTOMY AND SALPINGECTOMY: SHX6379

## 2012-03-12 ENCOUNTER — Encounter: Payer: Self-pay | Admitting: *Deleted

## 2012-04-29 ENCOUNTER — Other Ambulatory Visit: Payer: Self-pay | Admitting: Family Medicine

## 2012-06-13 ENCOUNTER — Other Ambulatory Visit: Payer: Self-pay | Admitting: Family Medicine

## 2012-06-26 ENCOUNTER — Other Ambulatory Visit: Payer: Self-pay | Admitting: Family Medicine

## 2012-06-26 MED ORDER — LEVOTHYROXINE SODIUM 175 MCG PO TABS: ORAL_TABLET | ORAL | Status: DC

## 2012-07-19 ENCOUNTER — Other Ambulatory Visit: Payer: Self-pay | Admitting: Family Medicine

## 2012-08-08 ENCOUNTER — Encounter: Payer: Self-pay | Admitting: Family Medicine

## 2012-08-08 ENCOUNTER — Other Ambulatory Visit (HOSPITAL_COMMUNITY)
Admission: RE | Admit: 2012-08-08 | Discharge: 2012-08-08 | Disposition: A | Discharge status: Home / Self Care | Payer: BC Managed Care – PPO | Source: Ambulatory Visit | Attending: Family Medicine | Admitting: Family Medicine

## 2012-08-08 ENCOUNTER — Ambulatory Visit (INDEPENDENT_AMBULATORY_CARE_PROVIDER_SITE_OTHER): Payer: BC Managed Care – PPO | Admitting: Family Medicine

## 2012-08-08 ENCOUNTER — Other Ambulatory Visit: Payer: Self-pay | Admitting: Family Medicine

## 2012-08-08 VITALS — BP 117/62 | HR 75 | Ht 67.0 in | Wt 247.0 lb

## 2012-08-08 DIAGNOSIS — N950 Postmenopausal bleeding: Secondary | ICD-10-CM

## 2012-08-08 DIAGNOSIS — N760 Acute vaginitis: Secondary | ICD-10-CM

## 2012-08-08 DIAGNOSIS — Z124 Encounter for screening for malignant neoplasm of cervix: Secondary | ICD-10-CM

## 2012-08-08 DIAGNOSIS — Z01419 Encounter for gynecological examination (general) (routine) without abnormal findings: Secondary | ICD-10-CM | POA: Insufficient documentation

## 2012-08-08 DIAGNOSIS — Z0000 Encounter for general adult medical examination without abnormal findings: Secondary | ICD-10-CM

## 2012-08-08 DIAGNOSIS — E039 Hypothyroidism, unspecified: Secondary | ICD-10-CM

## 2012-08-08 MED ORDER — SIMVASTATIN 40 MG PO TABS: 40.0000 mg | ORAL_TABLET | Freq: Every day | ORAL | Status: DC

## 2012-08-08 MED ORDER — LEVOTHYROXINE SODIUM 175 MCG PO TABS: ORAL_TABLET | ORAL | Status: DC

## 2012-08-08 MED ORDER — METOPROLOL TARTRATE 100 MG PO TABS: 100.0000 mg | ORAL_TABLET | Freq: Two times a day (BID) | ORAL | Status: DC

## 2012-08-08 MED ORDER — HYDROCHLOROTHIAZIDE 25 MG PO TABS: 25.0000 mg | ORAL_TABLET | Freq: Every day | ORAL | Status: DC

## 2012-08-08 NOTE — Patient Instructions (Addendum)
Keep up a regular exercise program and make sure you are eating a healthy diet Try to eat 4 servings of dairy a day, or if you are lactose intolerant take a calcium with vitamin D daily.  Your vaccines are up to date.   

## 2012-08-08 NOTE — Progress Notes (Signed)
Subjective:     Penny Bauer is a 62 y.o. female and is here for a comprehensive physical exam. The patient reports problems - noticed some vaginal spotting, brown d/c about 4-5 months ago.  Recently has been more red. almost seems cyclic like a period. Has noticed heavier d/c and odor latley.  Usually notices it in the afternoon. Fredna Dow will only for a day s or two.   History   Social History  . Marital Status: Married    Spouse Name: Trey Paula    Number of Children: 3  . Years of Education: N/A   Occupational History  . RN     [employer]    Social History Main Topics  . Smoking status: Never Smoker   . Smokeless tobacco: Not on file  . Alcohol Use: No  . Drug Use: No  . Sexually Active: Not on file   Other Topics Concern  . Not on file   Social History Narrative   Her daughers are Raphael Gibney and Lurena Joiner Huminski. No regular exercise   Health Maintenance  Topic Date Due  . Influenza Vaccine  09/30/2012  . Mammogram  03/07/2014  . Pap Smear  06/19/2014  . Colonoscopy  03/30/2015  . Tetanus/tdap  10/01/2015  . Zostavax  Completed    The following portions of the patient's history were reviewed and updated as appropriate: allergies, current medications, past family history, past medical history, past social history, past surgical history and problem list.  Review of Systems A comprehensive review of systems was negative.   Objective:    BP 117/62  Pulse 75  Ht 5\' 7"  (1.702 m)  Wt 247 lb (112.038 kg)  BMI 38.68 kg/m2 General appearance: alert, cooperative and appears stated age Head: Normocephalic, without obvious abnormality, atraumatic Eyes: conj clear, EOMi, PEERLA Ears: normal TM's and external ear canals both ears Nose: Nares normal. Septum midline. Mucosa normal. No drainage or sinus tenderness. Throat: lips, mucosa, and tongue normal; teeth and gums normal Neck: no adenopathy, no carotid bruit, no JVD, supple, symmetrical, trachea midline and  thyroid not enlarged, symmetric, no tenderness/mass/nodules Back: symmetric, no curvature. ROM normal. No CVA tenderness. Lungs: clear to auscultation bilaterally Breasts: not performed.  Heart: regular rate and rhythm, S1, S2 normal, no murmur, click, rub or gallop Abdomen: soft, non-tender; bowel sounds normal; no masses,  no organomegaly Pelvic: cervix normal in appearance, external genitalia normal, no adnexal masses or tenderness, no cervical motion tenderness, rectovaginal septum normal, uterus normal size, shape, and consistency and vaginal normal with thick yellow d/c at the os Extremities: extremities normal, atraumatic, no cyanosis or edema Pulses: 2+ and symmetric Skin: Skin color, texture, turgor normal. No rashes or lesions Lymph nodes: Cervical, supraclavicular, and axillary nodes normal. Neurologic: Alert and oriented X 3, normal strength and tone. Normal symmetric reflexes. Normal coordination and gait    Assessment:    Healthy female exam.      Plan:     See After Visit Summary for Counseling Recommendations  Keep up a regular exercise program and make sure you are eating a healthy diet Try to eat 4 servings of dairy a day, or if you are lactose intolerant take a calcium with vitamin D daily.  Your vaccines are up to date.   Postmenopausal bleeding-Pap smear performed today. No lesions in the vaginal canal. Cervix appears normal. She did have a thick yellow discharge and wet prep was collected and sent. We'll schedule her for a uterine ultrasound  and refer her to GYN for endometrial biopsy.  Hypogonadism-due for TSH. Also we have on her from [place] about 2 years ago. We have ordered labs on her twice since she did not go. She absolutely has to go today.  Palpitations-well-controlled on metoprolol. BX and release version is getting more expensive so she is willing to switch to the twice a day dosing for cost effectiveness.

## 2012-08-09 LAB — COMPLETE METABOLIC PANEL WITH GFR
Albumin: 4.1 g/dL (ref 3.5–5.2)
Alkaline Phosphatase: 83 U/L (ref 39–117)
BUN: 13 mg/dL (ref 6–23)
Creat: 0.66 mg/dL (ref 0.50–1.10)
GFR, Est Non African American: >89 mL/min
Glucose, Bld: 112 mg/dL — ABNORMAL HIGH (ref 70–99)
Total Bilirubin: 0.5 mg/dL (ref 0.3–1.2)

## 2012-08-09 LAB — LIPID PANEL
Cholesterol: 147 mg/dL (ref 0–200)
Total CHOL/HDL Ratio: 4.1 Ratio
Triglycerides: 106 mg/dL (ref <150)

## 2012-08-12 ENCOUNTER — Other Ambulatory Visit: Payer: Self-pay | Admitting: Family Medicine

## 2012-08-12 ENCOUNTER — Other Ambulatory Visit: Payer: Self-pay | Admitting: *Deleted

## 2012-08-12 DIAGNOSIS — E739 Lactose intolerance, unspecified: Secondary | ICD-10-CM

## 2012-08-12 DIAGNOSIS — E032 Hypothyroidism due to medicaments and other exogenous substances: Secondary | ICD-10-CM

## 2012-08-12 MED ORDER — LEVOTHYROXINE SODIUM 150 MCG PO TABS: ORAL_TABLET | ORAL | Status: DC

## 2012-08-19 ENCOUNTER — Telehealth: Payer: Self-pay | Admitting: Family Medicine

## 2012-08-19 NOTE — Telephone Encounter (Signed)
Pt informed told her that someone should be contacting her from their office to schedule.   Called women's health [phone] and lvm informing that referral was placed for pt on 7.10 and pt has yet to hear anything.Loralee Pacas [place]

## 2012-08-19 NOTE — Telephone Encounter (Signed)
Spoke with Archie Patten and she has an appt [phone].Heath Gold'

## 2012-08-19 NOTE — Telephone Encounter (Signed)
Please call patient. I did get a copy of her ultrasound report. She did have a thickened endometrium. This is abnormal in someone who is postmenopausal. She will definitely need endometrial biopsy. If she has not heard from the [facility] down the hall to schedule an appointment then please let me know. We did ask her for her information so they should have contacted her sometime last week. She also has a small uterine fibroid but this should not be causing any problems.

## 2012-08-27 ENCOUNTER — Encounter: Payer: Self-pay | Admitting: Obstetrics & Gynecology

## 2012-08-27 ENCOUNTER — Encounter: Payer: Self-pay | Admitting: *Deleted

## 2012-08-27 ENCOUNTER — Ambulatory Visit (INDEPENDENT_AMBULATORY_CARE_PROVIDER_SITE_OTHER): Payer: BC Managed Care – PPO | Admitting: Obstetrics & Gynecology

## 2012-08-27 DIAGNOSIS — N950 Postmenopausal bleeding: Secondary | ICD-10-CM

## 2012-08-27 NOTE — Progress Notes (Signed)
  Subjective:    Patient ID: Penny Bauer, female    DOB: 1951-01-30, 62 y.o.   MRN: [id number]  HPI  62 yo MW P3 [id number]) who is referred here by Dr. Linford Arnold for a 6 month h/o brownish vaginal discharge that has turned to PMB about 2 months ago. She is a [employer] fund[employer]. She had a pelvic u/s at [facility] about 2 weeks ago. She was told that there was "a thickening".  Review of Systems  Pap smear was normal 7/14.    Objective:   Physical Exam     Consent signed, time out done Cervix prepped with betadine and grasped with a single tooth tenaculum Uterus sounded to 8cm Pipelle used for 4 passes with a copious amount of tissue obtained. She tolerated the procedure well.      Assessment & Plan:

## 2012-09-03 ENCOUNTER — Ambulatory Visit (INDEPENDENT_AMBULATORY_CARE_PROVIDER_SITE_OTHER): Payer: BC Managed Care – PPO | Admitting: Obstetrics & Gynecology

## 2012-09-03 ENCOUNTER — Encounter: Payer: Self-pay | Admitting: Obstetrics & Gynecology

## 2012-09-03 DIAGNOSIS — C549 Malignant neoplasm of corpus uteri, unspecified: Secondary | ICD-10-CM

## 2012-09-03 DIAGNOSIS — C541 Malignant neoplasm of endometrium: Secondary | ICD-10-CM

## 2012-09-03 NOTE — Progress Notes (Signed)
  Subjective:    Patient ID: Penny Bauer, female    DOB: 06/06/1950, 62 y.o.   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 She prefers to have her treatment through [facility]. She has an appt 09-16-12.  Review of Systems     Objective:   Physical Exam        Assessment & Plan:  I will await to hear from the gyn onc to see if I can order and studies for them to help expedite her surgery.

## 2012-09-04 ENCOUNTER — Telehealth: Payer: Self-pay | Admitting: *Deleted

## 2012-09-04 NOTE — Telephone Encounter (Signed)
Lm message with pt's husband for appt with Dr Clifton James oncologist 09/05/12 @ 2:30 in [place].

## 2012-09-05 DIAGNOSIS — E039 Hypothyroidism, unspecified: Secondary | ICD-10-CM

## 2012-09-05 HISTORY — DX: Hypothyroidism, unspecified: E03.9

## 2012-09-06 ENCOUNTER — Encounter: Payer: Self-pay | Admitting: Family Medicine

## 2012-09-06 DIAGNOSIS — C541 Malignant neoplasm of endometrium: Secondary | ICD-10-CM

## 2012-09-06 DIAGNOSIS — Z8542 Personal history of malignant neoplasm of other parts of uterus: Secondary | ICD-10-CM | POA: Insufficient documentation

## 2012-09-06 HISTORY — DX: Malignant neoplasm of endometrium: C54.1

## 2012-09-12 ENCOUNTER — Ambulatory Visit: Payer: BC Managed Care – PPO | Admitting: Obstetrics & Gynecology

## 2012-11-21 ENCOUNTER — Encounter: Payer: Self-pay | Admitting: Family Medicine

## 2012-11-21 ENCOUNTER — Ambulatory Visit (INDEPENDENT_AMBULATORY_CARE_PROVIDER_SITE_OTHER): Payer: BC Managed Care – PPO | Admitting: Family Medicine

## 2012-11-21 VITALS — BP 117/65 | HR 65 | Wt 247.0 lb

## 2012-11-21 DIAGNOSIS — E739 Lactose intolerance, unspecified: Secondary | ICD-10-CM

## 2012-11-21 DIAGNOSIS — E032 Hypothyroidism due to medicaments and other exogenous substances: Secondary | ICD-10-CM

## 2012-11-21 DIAGNOSIS — D229 Melanocytic nevi, unspecified: Secondary | ICD-10-CM

## 2012-11-21 DIAGNOSIS — D239 Other benign neoplasm of skin, unspecified: Secondary | ICD-10-CM

## 2012-11-21 NOTE — Patient Instructions (Signed)
Return in 10 days for suture removal.

## 2012-11-21 NOTE — Progress Notes (Signed)
  Subjective:    Patient ID: Penny Bauer, female    DOB: May 23, 1950, 62 y.o.   MRN: [id number]  HPI    Review of Systems     Objective:   Physical Exam        Assessment & Plan:  Punch Biopsy Procedure Note  Pre-operative Diagnosis: Suspicious lesion  Post-operative Diagnosis: same  Locations:right mid back, lesion is 2 x 3 mm  Indications: discoloration  Anesthesia: Lidocaine 1% with epinephrine without added sodium bicarbonate  Procedure Details  History of allergy to iodine: no Patient informed of the risks (including bleeding and infection) and benefits of the  procedure and Verbal informed consent obtained.  The lesion and surrounding area was given a sterile prep using betadyne and draped in the usual sterile fashion. The skin was then stretched perpendicular to the skin tension lines and the lesion removed using the 4mm punch (full skin thickness punch bx). The resulting ellipse was then closed. The wound was closed with 4-0 Prolene using simple interrupted stitches. Antibiotic ointment and a sterile dressing applied. The specimen was sent for pathologic examination. The patient tolerated the procedure well.  EBL: 0.5 ml  Findings: Await pathology report  Condition: Stable  Complications: none.  Plan: 1. Instructed to keep the wound dry and covered for 24-48h and clean thereafter. 2. Warning signs of infection were reviewed.   3. Recommended that the patient use OTC acetaminophen as needed for pain.  4. Return for suture removal in 10 days

## 2012-11-22 LAB — HEMOGLOBIN A1C: Mean Plasma Glucose: 126 mg/dL — ABNORMAL HIGH (ref <117)

## 2012-12-02 ENCOUNTER — Ambulatory Visit: Payer: BC Managed Care – PPO

## 2013-01-16 ENCOUNTER — Ambulatory Visit (INDEPENDENT_AMBULATORY_CARE_PROVIDER_SITE_OTHER): Payer: BC Managed Care – PPO | Admitting: Family Medicine

## 2013-01-16 ENCOUNTER — Encounter: Payer: Self-pay | Admitting: Family Medicine

## 2013-01-16 ENCOUNTER — Telehealth: Payer: Self-pay | Admitting: *Deleted

## 2013-01-16 ENCOUNTER — Ambulatory Visit (INDEPENDENT_AMBULATORY_CARE_PROVIDER_SITE_OTHER): Payer: BC Managed Care – PPO

## 2013-01-16 VITALS — BP 114/63 | HR 83 | Temp 98.6°F | Ht 67.0 in | Wt 247.0 lb

## 2013-01-16 DIAGNOSIS — R935 Abnormal findings on diagnostic imaging of other abdominal regions, including retroperitoneum: Secondary | ICD-10-CM

## 2013-01-16 DIAGNOSIS — R1031 Right lower quadrant pain: Secondary | ICD-10-CM

## 2013-01-16 DIAGNOSIS — N739 Female pelvic inflammatory disease, unspecified: Secondary | ICD-10-CM

## 2013-01-16 DIAGNOSIS — R509 Fever, unspecified: Secondary | ICD-10-CM

## 2013-01-16 DIAGNOSIS — R188 Other ascites: Secondary | ICD-10-CM | POA: Insufficient documentation

## 2013-01-16 LAB — POCT URINALYSIS DIPSTICK
Bilirubin, UA: NEGATIVE
Glucose, UA: NEGATIVE
Ketones, UA: NEGATIVE
Leukocytes, UA: NEGATIVE
Nitrite, UA: NEGATIVE
Spec Grav, UA: 1.015
pH, UA: 7.5

## 2013-01-16 MED ORDER — DOXYCYCLINE HYCLATE 100 MG PO TABS: 100.0000 mg | ORAL_TABLET | Freq: Two times a day (BID) | ORAL | Status: DC

## 2013-01-16 MED ORDER — IOHEXOL 300 MG/ML  SOLN
100.0000 mL | Freq: Once | INTRAMUSCULAR | Status: AC | PRN
  Administered 2013-01-16: 100 mL via INTRAVENOUS

## 2013-01-16 MED ORDER — TRAMADOL HCL 50 MG PO TABS: 50.0000 mg | ORAL_TABLET | Freq: Three times a day (TID) | ORAL | Status: DC | PRN

## 2013-01-16 MED ORDER — METRONIDAZOLE 500 MG PO TABS: 500.0000 mg | ORAL_TABLET | Freq: Two times a day (BID) | ORAL | Status: DC

## 2013-01-16 NOTE — Telephone Encounter (Signed)
Pt is scheduled to see Gyn Onc provider on 01/17/13 at 9:15am in [facility] location. We are sending written CT report and CD with pt. Pt informed.  Meyer Cory, LPN

## 2013-01-16 NOTE — Telephone Encounter (Signed)
PA obtained for CT ABD/Pelvis w/contrast. Auth # [id number].  Meyer Cory, LPN

## 2013-01-16 NOTE — Progress Notes (Signed)
Subjective:    Patient ID: Penny Bauer, female    DOB: 09-27-1950, 62 y.o.   MRN: [id number]  HPI R-sided lower abd pain - x 4-5 days. Worse with positions. Worse with lying down. Has been sleeping in chair bc  Her husbnad has been hospital. Woke her up with pain.  Has subsided some as the day has progress.  Fever to 101 last week before the sxs started but not other URI sxs.  No nausea or change in bowel.  No blood in stool or urine.  No dysuria.  Pain does radiate towards the groin.  No hx of renal stones. Better with rest. Walking doesn't make it worse. Had complete hysterectomy over the summer.    Review of Systems    Hx of cholecystectomy/complete hysterectomy.   BP 114/63  Pulse 83  Temp(Src) 98.6 F (37 C)  Ht 5\' 7"  (1.702 m)  Wt 247 lb (112.038 kg)  BMI 38.68 kg/m2    No Known Allergies  Past Medical History  Diagnosis Date  . Hypertension   . Hyperlipidemia   . Adenocarcinoma     Past Surgical History  Procedure Laterality Date  . Tubal ligation  1989  . Lap-band surgery  April 2007  . Cholecystectomy  2003  . Cardiac catheterization  2001    30% blockage    History   Social History  . Marital Status: Married    Spouse Name: Trey Paula    Number of Children: 3  . Years of Education: N/A   Occupational History  . RN     [employer]    Social History Main Topics  . Smoking status: Never Smoker   . Smokeless tobacco: Not on file  . Alcohol Use: No  . Drug Use: No  . Sexual Activity: Not on file   Other Topics Concern  . Not on file   Social History Narrative   Her daughers are Raphael Gibney and Lurena Joiner Huminski. No regular exercise    Family History  Problem Relation Age of Onset  . Pancreatic cancer Mother   . Throat cancer Brother   . Heart attack Father 19  . Hypertension Father   . Stroke Father   . Depression Daughter   . Depression Mother   . Diabetes      Grandfather  . Liver disease Sister     Fatty liver     Outpatient Encounter Prescriptions as of 01/16/2013  Medication Sig  . aspirin 81 MG tablet Take 81 mg by mouth daily.    . fish oil-omega-3 fatty acids 1000 MG capsule Take 2 g by mouth daily.    . hydrochlorothiazide (HYDRODIURIL) 25 MG tablet Take 1 tablet (25 mg total) by mouth daily.  Marland Kitchen levothyroxine (SYNTHROID, LEVOTHROID) 150 MCG tablet TAKE ONE TABLET BY MOUTH ONE TIME DAILY  . metoprolol (LOPRESSOR) 100 MG tablet Take 1 tablet (100 mg total) by mouth 2 (two) times daily.  . simvastatin (ZOCOR) 40 MG tablet Take 1 tablet (40 mg total) by mouth at bedtime.       Objective:   Physical Exam  Constitutional: She is oriented to person, place, and time. She appears well-developed and well-nourished.  HENT:  Head: Normocephalic and atraumatic.  Cardiovascular: Normal rate, regular rhythm and normal heart sounds.   Pulmonary/Chest: Effort normal and breath sounds normal.  Abdominal: Soft. Bowel sounds are normal. She exhibits no distension and no mass. There is tenderness. There is no rebound and no guarding.  Exquisitely tender in the RLQ with some guarding  Neurological: She is alert and oriented to person, place, and time.  Skin: Skin is warm and dry.  Psychiatric: She has a normal mood and affect. Her behavior is normal.          Assessment & Plan:  RLQ pain - am very concerned about the possibility of appendicitis. She had some low-grade temps last week without any other significant symptoms such as cold or respiratory infection. And now she's having significant right lower quadrant pain. At its worst is an 8/10. With palpation at an 8/10. At rest it subsides to about 3/10. Fortunately she has not noticed any blood in the stool or nausea. Will get CT of the abdomen and pelvis with contrast to rule out appendicitis. Review results with patient.  CT scan showed abscess in the right lower Kocher. Approximately 4 x 5 cm. It seems to have a wall around it. Tumor is not  completely ruled out. She did have a complete hysterectomy in the summer for endometrial cancer. We [facility] to see if they could get her in today or early tomorrow for surgical consult. Because of a hysterectomy they recommended that she see her gynecologist oncologist to make sure that it is not a complication related to the surgery that she had the summer. They're able to see her first thing in the morning but it as that we go ahead and place her on antibiotic. She was given 250 mg of ceftriaxone. We'll also send her a prescription for metronidazole 5 her milligrams twice a day x14 days with doxycycline 100 mg twice a day x14 days. If she gets worse overnight, pain increases, starts to run a fever or vomit in the emergency department immediately. Right now her pain level is about a 2-3/10. Will send over tramadol.

## 2013-01-17 ENCOUNTER — Encounter: Payer: Self-pay | Admitting: Family Medicine

## 2013-01-17 DIAGNOSIS — N731 Chronic parametritis and pelvic cellulitis: Secondary | ICD-10-CM

## 2013-01-17 DIAGNOSIS — E119 Type 2 diabetes mellitus without complications: Secondary | ICD-10-CM

## 2013-01-17 HISTORY — DX: Type 2 diabetes mellitus without complications: E11.9

## 2013-01-17 MED ORDER — CEFTRIAXONE SODIUM 250 MG IJ SOLR
250.0000 mg | Freq: Once | INTRAMUSCULAR | Status: AC
  Administered 2013-01-17: 250 mg via INTRAMUSCULAR

## 2013-01-17 NOTE — Addendum Note (Signed)
Addended by: Deno Etienne on: 01/17/2013 05:06 PM   Modules accepted: Orders

## 2013-01-20 ENCOUNTER — Telehealth: Payer: Self-pay | Admitting: *Deleted

## 2013-01-20 NOTE — Telephone Encounter (Signed)
Pt calls office and LM on [place] VM stating that she wanted you to know that she did see Dr. Clifton James and went over the CT results and she said it was a Lymphocele that they will sometimes see after uterine surgery and she switched her antibiotics to Cipro.  Told her this should help her feel better but if not the next step would be to put a drain in.  Pt states she is feeling much better.  FYI

## 2013-03-31 ENCOUNTER — Other Ambulatory Visit: Payer: Self-pay | Admitting: Family Medicine

## 2013-04-15 ENCOUNTER — Encounter: Payer: Self-pay | Admitting: *Deleted

## 2013-05-01 ENCOUNTER — Ambulatory Visit (INDEPENDENT_AMBULATORY_CARE_PROVIDER_SITE_OTHER): Payer: Commercial Managed Care - HMO | Admitting: Family Medicine

## 2013-05-01 ENCOUNTER — Encounter: Payer: Self-pay | Admitting: Family Medicine

## 2013-05-01 VITALS — BP 123/64 | HR 69 | Wt 247.0 lb

## 2013-05-01 DIAGNOSIS — L989 Disorder of the skin and subcutaneous tissue, unspecified: Secondary | ICD-10-CM

## 2013-05-01 DIAGNOSIS — C541 Malignant neoplasm of endometrium: Secondary | ICD-10-CM

## 2013-05-01 MED ORDER — METOPROLOL TARTRATE 100 MG PO TABS: 100.0000 mg | ORAL_TABLET | Freq: Two times a day (BID) | ORAL | Status: DC

## 2013-05-01 MED ORDER — HYDROCHLOROTHIAZIDE 25 MG PO TABS: 25.0000 mg | ORAL_TABLET | Freq: Every day | ORAL | Status: DC

## 2013-05-01 MED ORDER — LEVOTHYROXINE SODIUM 150 MCG PO TABS: ORAL_TABLET | ORAL | Status: DC

## 2013-05-01 NOTE — Progress Notes (Signed)
   Subjective:    Patient ID: Penny Bauer, female    DOB: 02/27/1950, 63 y.o.   MRN: [id number]  HPI she has a lesion on her upper anterior chest and her neck. It's not painful or itchy or irritating but has noticed that it's been slowly getting larger and is concerned. She did have an atypical nevus on her back back in the fall. She does have a history of endometrial cancer but no skin cancer. She noticed the lesion initially 3 weeks ago and was much smaller at that time.   Review of Systems     Objective:   Physical Exam  She has a pink round macular lesion with irregular borders on her upper chest near the sternoclavicular notch. Approximately 0.9 x 1 cm in size. It is slightly raised. It has scale on the lower half and is more shiny on the upper half. Able to visualize blood vessels in the lesion itself.      Assessment & Plan:  Suspicious skin lesion-it does have a somewhat shiny appearance consistent with basal cell but the lower end of the lesion is more consistent with a seborrheic keratosis but does have increased vascularization. Small blood vessels are apparent on exam. Shave biopsy performed. Please see note below for procedure.  Shave Biopsy Procedure Note  Pre-operative Diagnosis: Suspicious lesion  Post-operative Diagnosis: same  Locations:upper mid chest near the sternoclavicular notch  Indications: getting larger.   Anesthesia: Lidocaine 1% with epinephrine without added sodium bicarbonate  Procedure Details  History of allergy to iodine: no  Patient informed of the risks (including bleeding and infection) and benefits of the  procedure and Verbal informed consent obtained.  The lesion and surrounding area were given a sterile prep using betadyne and draped in the usual sterile fashion. A scalpel was used to shave an area of skin approximately 1.5cm by 1.5cm.  Hemostasis achieved with alumuninum chloride. Antibiotic ointment and a sterile dressing applied.   The specimen was sent for pathologic examination. The patient tolerated the procedure well.  EBL: 0 ml  Findings: Await pathology   Condition: Stable  Complications: none.  Plan: 1. Instructed to keep the wound dry and covered for 24-48h and clean thereafter. 2. Warning signs of infection were reviewed.   3. Recommended that the patient use OTC acetaminophen as needed for pain.  4. Return prn

## 2013-05-06 ENCOUNTER — Other Ambulatory Visit: Payer: Self-pay | Admitting: *Deleted

## 2013-05-06 MED ORDER — METOPROLOL TARTRATE 100 MG PO TABS: 100.0000 mg | ORAL_TABLET | Freq: Two times a day (BID) | ORAL | Status: DC

## 2013-05-06 MED ORDER — HYDROCHLOROTHIAZIDE 25 MG PO TABS: 25.0000 mg | ORAL_TABLET | Freq: Every day | ORAL | Status: DC

## 2013-08-09 ENCOUNTER — Other Ambulatory Visit: Payer: Self-pay | Admitting: Family Medicine

## 2013-08-12 ENCOUNTER — Telehealth: Payer: Self-pay | Admitting: Family Medicine

## 2013-08-12 NOTE — Telephone Encounter (Signed)
Needs to schedule f/u for her prediabetes, thyroid, etc.

## 2013-08-14 NOTE — Telephone Encounter (Signed)
Left msg vm to call to schedule appt.

## 2013-09-02 ENCOUNTER — Telehealth: Payer: Self-pay | Admitting: *Deleted

## 2013-09-02 DIAGNOSIS — C549 Malignant neoplasm of corpus uteri, unspecified: Secondary | ICD-10-CM

## 2013-09-02 NOTE — Telephone Encounter (Signed)
Pt called about possibly needing a referral for [facility] in [facility] for family risk screening for cancer. I informed her that this should be initiated by her oncologist however, if needed we will place the referral. She has an appt on 09/05/13 @ 130. I told her to check with the oncologist and get back with me to let me know if we indeed need to place the referral for her.Penny Bauer [name]

## 2013-09-02 NOTE — Addendum Note (Signed)
Addended by: Teddy Spike on: 09/02/2013 04:48 PM   Modules accepted: Orders

## 2013-09-02 NOTE — Telephone Encounter (Signed)
Referral placed.Penny Bauer  

## 2013-09-08 ENCOUNTER — Ambulatory Visit: Payer: Commercial Managed Care - HMO | Admitting: Family Medicine

## 2013-10-16 ENCOUNTER — Encounter: Payer: Self-pay | Admitting: Family Medicine

## 2013-10-16 ENCOUNTER — Ambulatory Visit (INDEPENDENT_AMBULATORY_CARE_PROVIDER_SITE_OTHER): Payer: Commercial Managed Care - HMO | Admitting: Family Medicine

## 2013-10-16 VITALS — BP 112/65 | HR 66 | Temp 98.6°F | Ht 66.0 in | Wt 253.0 lb

## 2013-10-16 DIAGNOSIS — E739 Lactose intolerance, unspecified: Secondary | ICD-10-CM

## 2013-10-16 DIAGNOSIS — Z23 Encounter for immunization: Secondary | ICD-10-CM

## 2013-10-16 DIAGNOSIS — R600 Localized edema: Secondary | ICD-10-CM

## 2013-10-16 DIAGNOSIS — E032 Hypothyroidism due to medicaments and other exogenous substances: Secondary | ICD-10-CM

## 2013-10-16 DIAGNOSIS — Z8679 Personal history of other diseases of the circulatory system: Secondary | ICD-10-CM

## 2013-10-16 DIAGNOSIS — R609 Edema, unspecified: Secondary | ICD-10-CM

## 2013-10-16 LAB — POCT GLYCOSYLATED HEMOGLOBIN (HGB A1C): Hemoglobin A1C: 6.2

## 2013-10-16 MED ORDER — LEVOTHYROXINE SODIUM 150 MCG PO TABS: ORAL_TABLET | ORAL | Status: DC

## 2013-10-16 MED ORDER — METOPROLOL TARTRATE 100 MG PO TABS: 100.0000 mg | ORAL_TABLET | Freq: Two times a day (BID) | ORAL | Status: DC

## 2013-10-16 MED ORDER — SIMVASTATIN 40 MG PO TABS: ORAL_TABLET | ORAL | Status: DC

## 2013-10-16 MED ORDER — HYDROCHLOROTHIAZIDE 25 MG PO TABS: 25.0000 mg | ORAL_TABLET | Freq: Every day | ORAL | Status: DC

## 2013-10-16 NOTE — Assessment & Plan Note (Addendum)
11 A1c is up today at 6.2 from previous. We discussed the importance of getting back on tract with diet and exercise. F/U in 6 months.

## 2013-10-16 NOTE — Assessment & Plan Note (Signed)
ON metoprolol.  Well controlled.

## 2013-10-16 NOTE — Progress Notes (Signed)
   Subjective:    Patient ID: Penny Bauer, female    DOB: [birth year], 63 y.o.   MRN: [id number]  HPI IFG  - no hypoglycemic events. No wounds or sores that are not healing well. No increased thirst or urination.has been stress eating but is worried that her A1c will be elevated today. Lab Results  Component Value Date   HGBA1C 6.0* 11/21/2012    Palpitations and edema- Pt denies chest pain, SOB, dizziness, or heart palpitations.  Taking meds as directed w/o problems.  Denies medication side effects.    hyperlipdemia - Due to recheck lipids. Tolerating statin well.   Hypothyroidism-no recent changes in skin or hair. She has been stressed. No recent weight changes. She takes her medication regularly. She feels that the medications working well. Review of Systems     Objective:   Physical Exam  Constitutional: She is oriented to person, place, and time. She appears well-developed and well-nourished.  HENT:  Head: Normocephalic and atraumatic.  Cardiovascular: Normal rate, regular rhythm and normal heart sounds.   Pulmonary/Chest: Effort normal and breath sounds normal.  Neurological: She is alert and oriented to person, place, and time.  Skin: Skin is warm and dry.  Psychiatric: She has a normal mood and affect. Her behavior is normal.          Assessment & Plan:  Flu shot given today.   Discussed need for Prevnar.

## 2013-10-16 NOTE — Assessment & Plan Note (Signed)
On hctz for this(not for blood pressure).

## 2013-10-16 NOTE — Assessment & Plan Note (Signed)
Due to recheck TSH. 

## 2013-11-06 ENCOUNTER — Telehealth: Payer: Self-pay | Admitting: *Deleted

## 2013-11-06 DIAGNOSIS — C541 Malignant neoplasm of endometrium: Secondary | ICD-10-CM

## 2013-11-06 NOTE — Telephone Encounter (Signed)
Referral placed.Penny Bauer  

## 2013-12-11 LAB — LIPID PANEL
CHOL/HDL RATIO: 3.5 ratio
Cholesterol: 143 mg/dL (ref 0–200)
HDL: 41 mg/dL (ref >39)
LDL CALC: 77 mg/dL (ref 0–99)
TRIGLYCERIDES: 126 mg/dL (ref <150)
VLDL: 25 mg/dL (ref 0–40)

## 2013-12-11 LAB — COMPLETE METABOLIC PANEL WITH GFR
ALT: 14 U/L (ref 0–35)
AST: 12 U/L (ref 0–37)
Albumin: 3.8 g/dL (ref 3.5–5.2)
Alkaline Phosphatase: 95 U/L (ref 39–117)
BUN: 12 mg/dL (ref 6–23)
CO2: 29 meq/L (ref 19–32)
Calcium: 9.6 mg/dL (ref 8.4–10.5)
Chloride: 102 mEq/L (ref 96–112)
Creat: 0.66 mg/dL (ref 0.50–1.10)
GFR, Est Non African American: >89 mL/min
Glucose, Bld: 117 mg/dL — ABNORMAL HIGH (ref 70–99)
Potassium: 4.1 mEq/L (ref 3.5–5.3)
SODIUM: 140 meq/L (ref 135–145)
TOTAL PROTEIN: 7.3 g/dL (ref 6.0–8.3)
Total Bilirubin: 0.6 mg/dL (ref 0.2–1.2)

## 2013-12-11 LAB — TSH: TSH: 0.984 u[IU]/mL (ref 0.350–4.500)

## 2013-12-14 NOTE — Progress Notes (Signed)
Quick Note:  All labs are normal. ______ 

## 2014-02-03 IMAGING — CT CT ABD-PELV W/ CM
1 series · 15 of 32 positions shown, 19 images · IV contrast (omnipaque)
Comparison: None.

CLINICAL DATA: Right lower quadrant pain.  Fever.

EXAM:
CT ABDOMEN AND PELVIS WITH CONTRAST
TECHNIQUE: Multidetector CT imaging of the abdomen and pelvis was performed
using the standard protocol following bolus administration of
intravenous contrast.
CONTRAST:  100mL OMNIPAQUE IOHEXOL 300 MG/ML  SOLN

[Series 2: abd/pelvis with · axial · 0.78mm/px · z∈[-475,-75]mm · 15 of 89 slices shown, 19 images]
[im 6/89  soft-tissue]
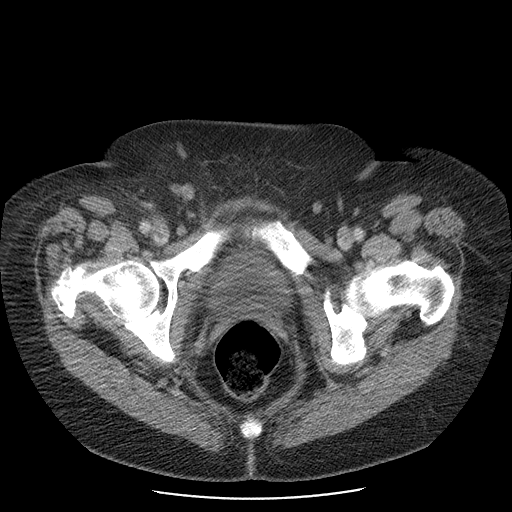
[im 6/89  bone]
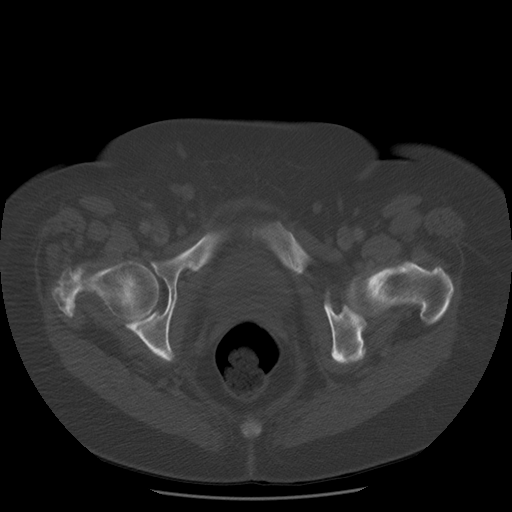
[im 12/89  soft-tissue]
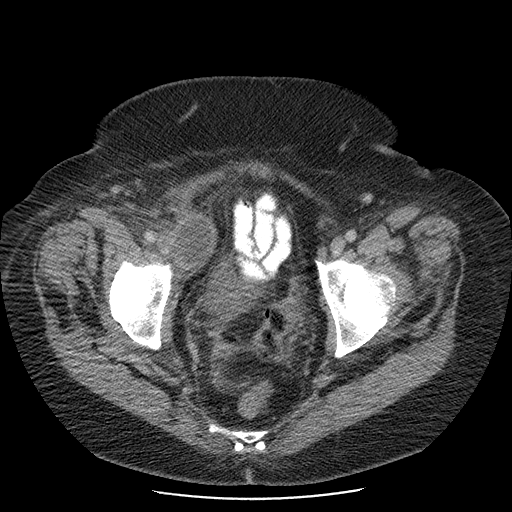
[im 18/89  soft-tissue]
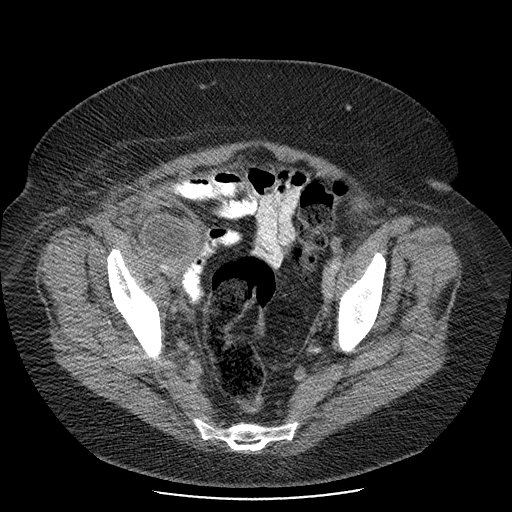
[im 26/89  soft-tissue]
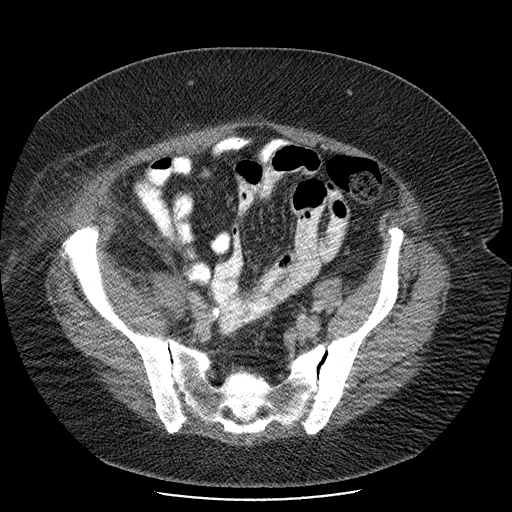
[im 32/89  soft-tissue]
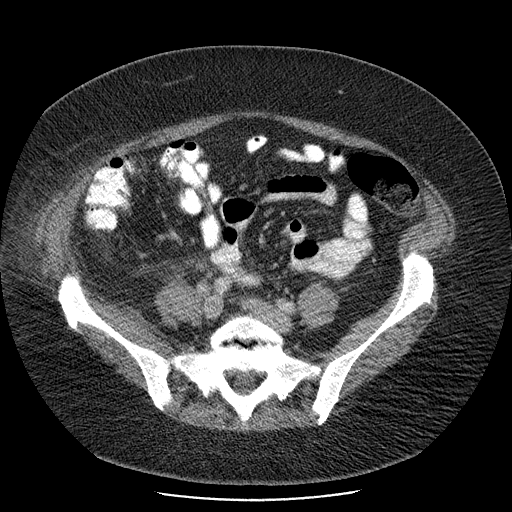
[im 37/89  soft-tissue]
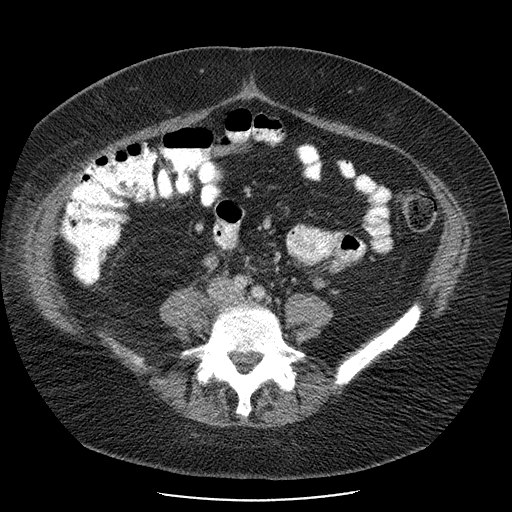
[im 46/89  soft-tissue]
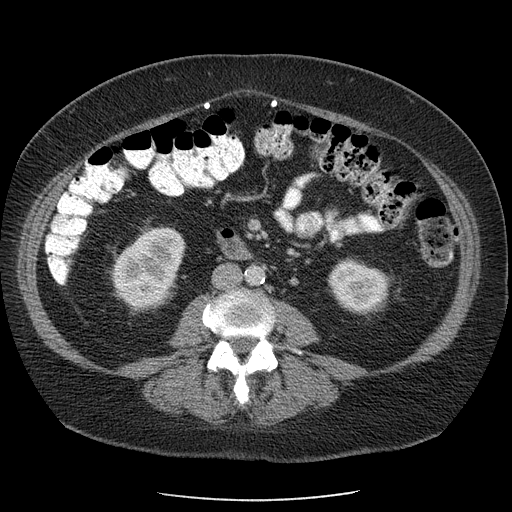
[im 52/89  soft-tissue]
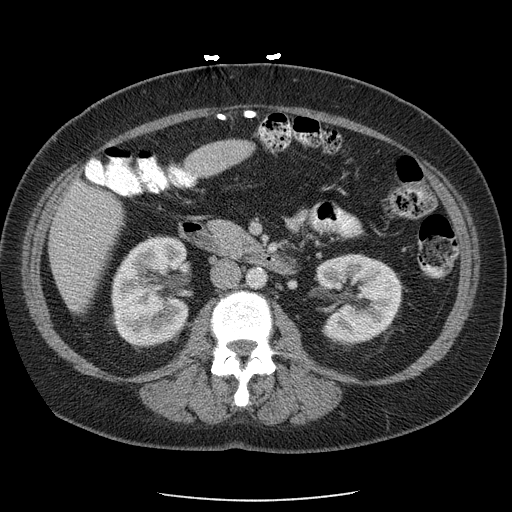
[im 57/89  soft-tissue]
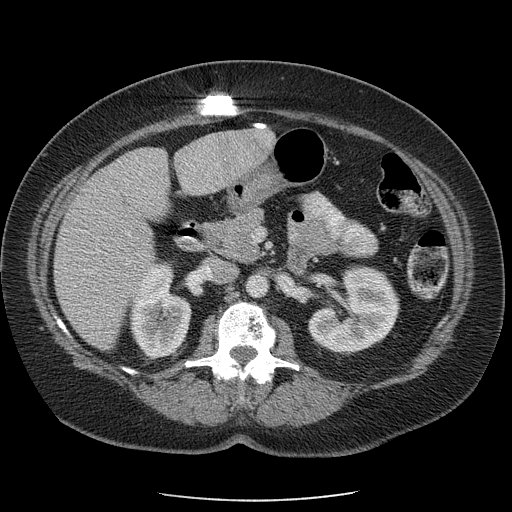
[im 57/89  bone]
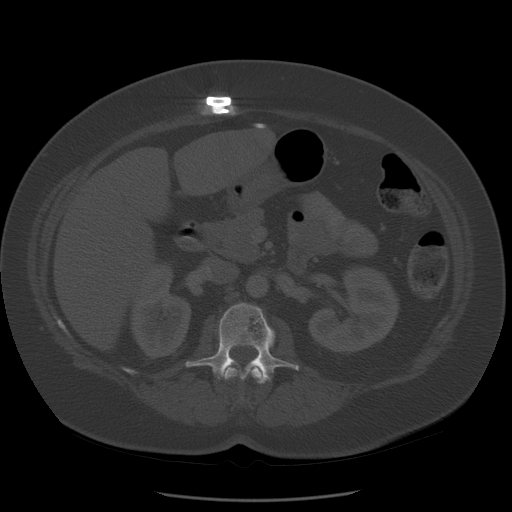
[im 63/89  soft-tissue]
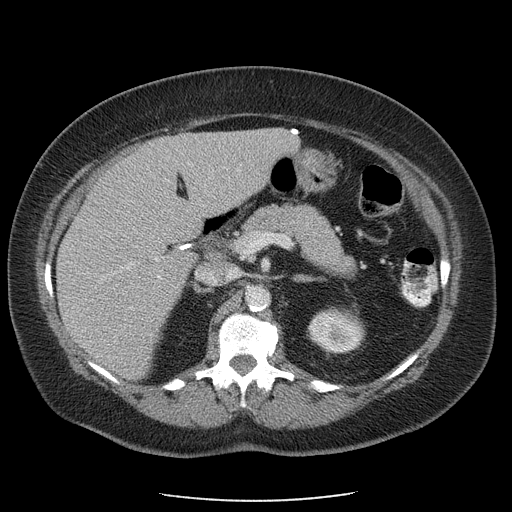
[im 71/89  soft-tissue]
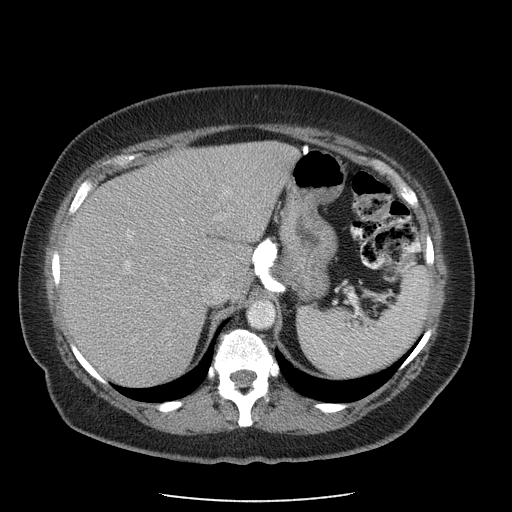
[im 77/89  soft-tissue]
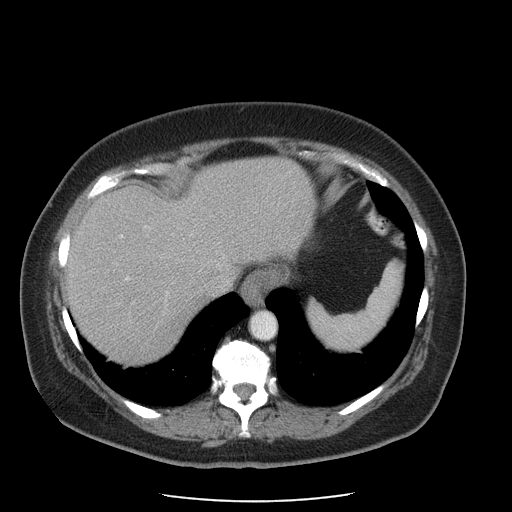
[im 77/89  lung]
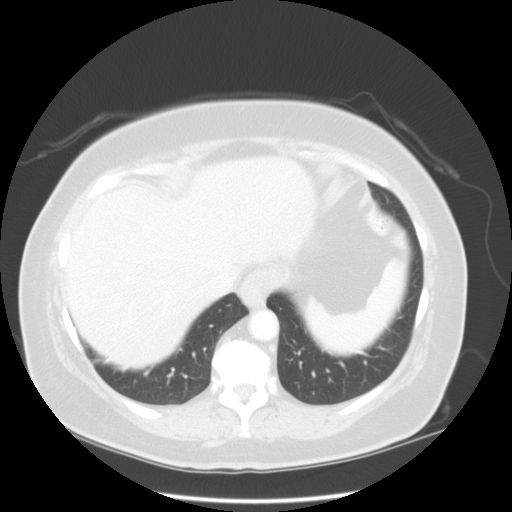
[im 80/89  lung]
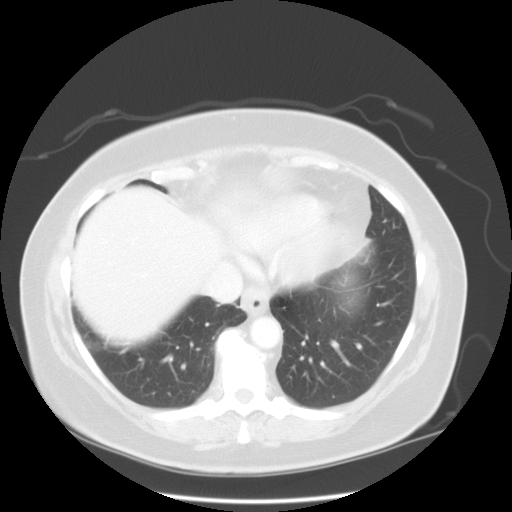
[im 83/89  soft-tissue]
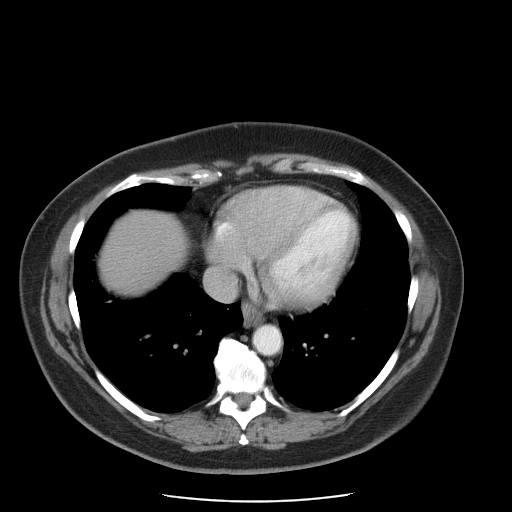
[im 83/89  lung]
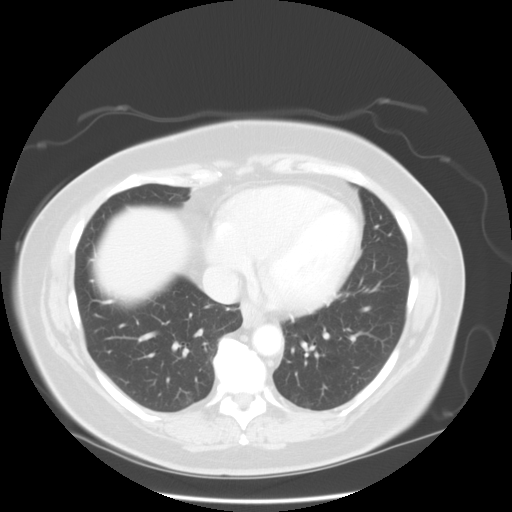
[im 86/89  lung]
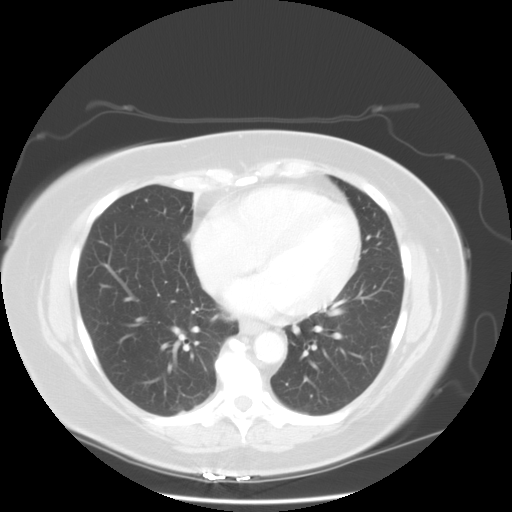

[15 of 32 positions shown; findings below may reference images not displayed]

FINDINGS: Liver normal. Spleen normal. Stenosis. Pancreas normal.
Cholecystectomy. No biliary distention. Portal vein and splenic vein
patent.

Adrenals normal. No focal renal abnormality. No hydronephrosis. No
evidence of obstructing ureteral stone. Bladder is nondistended.
Phleboliths. Hysterectomy.

Multiple small right inguinal and right iliac lymph nodes are
present. Shotty retroperitoneal lymph nodes are present. Aortoiliac
and visceral vascular calcification present consistent with
atherosclerotic vascular disease. No aneurysm.

The appendix is normal. There is no evidence of bowel distention.
Stool is present in the colon. There is no evidence of gastric
distention. Small sliding hiatal hernia is present. A gastric band
is present. Noted in the right lower quadrant, representative image
number 73/series 2, is defined fluid collection with slightly
thickened wall and adjacent fat plane thickening measuring 4.9 x
cm. Given the patient's symptoms this may represent an abscess. This
could be of gynecologic/ovarian origin and other etiologies of
adnexal masses including tumor cannot be excluded. Small amount of
free pelvic fluid is present. No free air.

Lung bases are clear. Tiny umbilical hernia is present with
herniation of fat only. Abdominal wall is otherwise intact. Mild
right lower quadrant abdominal wall edema is present adjacent to the
abdominal/pelvic process. Degenerative changes thoracolumbar spine.
No acute bony abnormality.
IMPRESSION: 4.9 x 4.2 right lower pelvic fluid collection with thickened wall.
Adjacent inguinal and iliac lymph nodes are present. Mild lower
pelvic wall edema is present. This could represent an abscess
possibly of gynecologic origin. The possibility of malignancy cannot
be excluded. The appendix is normal.

## 2014-02-26 DIAGNOSIS — C541 Malignant neoplasm of endometrium: Secondary | ICD-10-CM | POA: Diagnosis not present

## 2014-02-26 DIAGNOSIS — Z90710 Acquired absence of both cervix and uterus: Secondary | ICD-10-CM | POA: Diagnosis not present

## 2014-02-26 DIAGNOSIS — Z90722 Acquired absence of ovaries, bilateral: Secondary | ICD-10-CM | POA: Diagnosis not present

## 2014-02-26 DIAGNOSIS — Z08 Encounter for follow-up examination after completed treatment for malignant neoplasm: Secondary | ICD-10-CM | POA: Diagnosis not present

## 2014-02-26 DIAGNOSIS — E780 Pure hypercholesterolemia: Secondary | ICD-10-CM | POA: Diagnosis not present

## 2014-02-26 DIAGNOSIS — Z9049 Acquired absence of other specified parts of digestive tract: Secondary | ICD-10-CM | POA: Diagnosis not present

## 2014-02-26 DIAGNOSIS — E039 Hypothyroidism, unspecified: Secondary | ICD-10-CM | POA: Diagnosis not present

## 2014-02-26 DIAGNOSIS — Z9079 Acquired absence of other genital organ(s): Secondary | ICD-10-CM | POA: Diagnosis not present

## 2014-02-26 DIAGNOSIS — Z8619 Personal history of other infectious and parasitic diseases: Secondary | ICD-10-CM | POA: Diagnosis not present

## 2014-02-26 DIAGNOSIS — Z8542 Personal history of malignant neoplasm of other parts of uterus: Secondary | ICD-10-CM | POA: Diagnosis not present

## 2014-04-07 ENCOUNTER — Other Ambulatory Visit: Payer: Self-pay | Admitting: Family Medicine

## 2014-04-15 DIAGNOSIS — Z1231 Encounter for screening mammogram for malignant neoplasm of breast: Secondary | ICD-10-CM | POA: Diagnosis not present

## 2014-04-15 LAB — HM MAMMOGRAPHY

## 2014-04-16 ENCOUNTER — Ambulatory Visit: Payer: Commercial Managed Care - HMO | Admitting: Family Medicine

## 2014-04-30 ENCOUNTER — Encounter: Payer: Self-pay | Admitting: Family Medicine

## 2014-05-12 ENCOUNTER — Ambulatory Visit (INDEPENDENT_AMBULATORY_CARE_PROVIDER_SITE_OTHER): Payer: Commercial Managed Care - HMO | Admitting: Family Medicine

## 2014-05-12 ENCOUNTER — Encounter: Payer: Self-pay | Admitting: Family Medicine

## 2014-05-12 VITALS — BP 116/67 | HR 87 | Ht 66.0 in | Wt 257.0 lb

## 2014-05-12 DIAGNOSIS — N951 Menopausal and female climacteric states: Secondary | ICD-10-CM

## 2014-05-12 DIAGNOSIS — E739 Lactose intolerance, unspecified: Secondary | ICD-10-CM | POA: Diagnosis not present

## 2014-05-12 DIAGNOSIS — M25511 Pain in right shoulder: Secondary | ICD-10-CM | POA: Diagnosis not present

## 2014-05-12 DIAGNOSIS — R002 Palpitations: Secondary | ICD-10-CM

## 2014-05-12 LAB — POCT GLYCOSYLATED HEMOGLOBIN (HGB A1C): Hemoglobin A1C: 6.1

## 2014-05-12 MED ORDER — METOPROLOL TARTRATE 100 MG PO TABS: 100.0000 mg | ORAL_TABLET | Freq: Two times a day (BID) | ORAL | Status: DC

## 2014-05-12 MED ORDER — HYDROCHLOROTHIAZIDE 25 MG PO TABS: 25.0000 mg | ORAL_TABLET | Freq: Every day | ORAL | Status: DC

## 2014-05-12 NOTE — Progress Notes (Signed)
   Subjective:    Patient ID: Penny Bauer, female    DOB: 31-Jul-1950, 64 y.o.   MRN: [id number]  HPI Palpitations - on metoprolol and doing well. No CP or SOB.    IfG - no inc thirst or urination. Doing well. Has been trying to start exercising. Plans on staring Silver Sneakers.  Hasn't been able to exercise much as has been taking care of her husband who has been dealing with a diabetic foot ulcer.     right shoulder pain since September. It started right after she had to immunizations in that shoulder. Ever since then she's had some discomfort and some tingling that radiates down her fingertips. She denies any neck pain or trauma. She denies any old injuries to the shoulder or clavicle. It does not wake her up at night. She is able to sleep on that shoulder. It really bothers her if she rests her elbow on the armrest periods and she gets pain over the deltoid.   Review of Systems     Objective:   Physical Exam  Constitutional: She is oriented to person, place, and time. She appears well-developed and well-nourished.  HENT:  Head: Normocephalic and atraumatic.  Cardiovascular: Normal rate, regular rhythm and normal heart sounds.   No carotid bruit   Pulmonary/Chest: Effort normal and breath sounds normal.  Neurological: She is alert and oriented to person, place, and time.  Skin: Skin is warm and dry.  Psychiatric: She has a normal mood and affect. Her behavior is normal.       Assessment & Plan:  Palpitations - RF metoprolol.  F/U in 6 months.   IFG - A1C is 6.1 today. F/U in 6 months.   Right shoulder pain- normal range of motion. Good strength. Positive impingement test. Will get x-ray today for further evaluation. Suspect subacromial bursitis.  Rotator cuff seems in good shape.

## 2014-05-13 ENCOUNTER — Ambulatory Visit: Payer: Commercial Managed Care - HMO | Admitting: Family Medicine

## 2014-05-13 LAB — BASIC METABOLIC PANEL
BUN: 13 mg/dL (ref 6–23)
CHLORIDE: 104 meq/L (ref 96–112)
CO2: 29 mEq/L (ref 19–32)
Calcium: 9.5 mg/dL (ref 8.4–10.5)
Creat: 0.65 mg/dL (ref 0.50–1.10)
Glucose, Bld: 94 mg/dL (ref 70–99)
POTASSIUM: 4.1 meq/L (ref 3.5–5.3)
SODIUM: 141 meq/L (ref 135–145)

## 2014-05-13 NOTE — Progress Notes (Signed)
Quick Note:  All labs are normal. ______ 

## 2014-05-29 ENCOUNTER — Other Ambulatory Visit: Payer: Self-pay | Admitting: *Deleted

## 2014-05-29 DIAGNOSIS — C541 Malignant neoplasm of endometrium: Secondary | ICD-10-CM

## 2014-06-01 ENCOUNTER — Other Ambulatory Visit: Payer: Self-pay | Admitting: *Deleted

## 2014-06-04 DIAGNOSIS — Z6841 Body Mass Index (BMI) 40.0 and over, adult: Secondary | ICD-10-CM | POA: Diagnosis not present

## 2014-06-04 DIAGNOSIS — C541 Malignant neoplasm of endometrium: Secondary | ICD-10-CM | POA: Diagnosis not present

## 2014-06-04 DIAGNOSIS — Z8542 Personal history of malignant neoplasm of other parts of uterus: Secondary | ICD-10-CM | POA: Diagnosis not present

## 2014-10-15 ENCOUNTER — Ambulatory Visit (INDEPENDENT_AMBULATORY_CARE_PROVIDER_SITE_OTHER): Payer: Commercial Managed Care - HMO | Admitting: Osteopathic Medicine

## 2014-10-15 VITALS — BP 134/70 | HR 70 | Temp 98.2°F | Ht 66.0 in | Wt 257.0 lb

## 2014-10-15 DIAGNOSIS — M546 Pain in thoracic spine: Secondary | ICD-10-CM | POA: Diagnosis not present

## 2014-10-15 DIAGNOSIS — M7918 Myalgia, other site: Secondary | ICD-10-CM

## 2014-10-15 DIAGNOSIS — R109 Unspecified abdominal pain: Secondary | ICD-10-CM

## 2014-10-15 LAB — POCT URINALYSIS DIPSTICK
BILIRUBIN UA: NEGATIVE
Blood, UA: NEGATIVE
GLUCOSE UA: NEGATIVE
KETONES UA: NEGATIVE
LEUKOCYTES UA: NEGATIVE
Nitrite, UA: NEGATIVE
Protein, UA: NEGATIVE
Spec Grav, UA: 1.015
Urobilinogen, UA: 0.2
pH, UA: 7

## 2014-10-15 MED ORDER — MELOXICAM 7.5 MG PO TABS: 7.5000 mg | ORAL_TABLET | Freq: Two times a day (BID) | ORAL | Status: DC | PRN

## 2014-10-15 MED ORDER — CYCLOBENZAPRINE HCL 5 MG PO TABS: 5.0000 mg | ORAL_TABLET | Freq: Three times a day (TID) | ORAL | Status: DC | PRN

## 2014-10-15 NOTE — Patient Instructions (Signed)

## 2014-10-15 NOTE — Progress Notes (Signed)
HPI: Penny Bauer is a 64 y.o. female who presents to [facility]  today for chief complaint of:  Chief Complaint  Patient presents with  . right flank pain concern for kidney stone    . Location: R flank . Quality: cramping, sharp pain . Duration: 3 days . Timing:intermittent . Context: woke up with the pain a few days ago, owrried she slept wrong/ No hx stones . Modifying factors: twisting motion makes it worse . Assoc signs/symptoms: no urinary problems   Past medical, social and family history reviewed: Past Medical History  Diagnosis Date  . Hypertension   . Hyperlipidemia   . Adenocarcinoma   . Type II or unspecified type diabetes mellitus without mention of complication, not stated as uncontrolled 01/17/2013   Past Surgical History  Procedure Laterality Date  . Tubal ligation  1989  . Lap-band surgery  April 2007  . Cholecystectomy  2003  . Cardiac catheterization  2001    30% blockage  . Robotic assisted laparoscopic hysterectomy and salpingectomy  2014   Social History  Substance Use Topics  . Smoking status: Never Smoker   . Smokeless tobacco: Not on file  . Alcohol Use: No   Family History  Problem Relation Age of Onset  . Pancreatic cancer Mother   . Throat cancer Brother   . Heart attack Father 84  . Hypertension Father   . Stroke Father   . Depression Daughter   . Depression Mother   . Diabetes      Grandfather  . Liver disease Sister     Fatty liver    Current Outpatient Prescriptions  Medication Sig Dispense Refill  . aspirin 81 MG tablet Take 81 mg by mouth daily.      . hydrochlorothiazide (HYDRODIURIL) 25 MG tablet Take 1 tablet (25 mg total) by mouth daily. 90 tablet 1  . levothyroxine (SYNTHROID, LEVOTHROID) 150 MCG tablet TAKE 1 TABLET EVERY DAY 90 tablet 3  . metoprolol (LOPRESSOR) 100 MG tablet Take 1 tablet (100 mg total) by mouth 2 (two) times daily. 180 tablet 1  . Multiple Vitamin  (MULTIVITAMIN) capsule Take 1 capsule by mouth.    . simvastatin (ZOCOR) 40 MG tablet TAKE 1 TABLET AT BEDTIME 90 tablet 1   No current facility-administered medications for this visit.   No Known Allergies    Review of Systems: CONSTITUTIONAL: Neg fever/chills, CARDIAC: No chest pain/pressure/palpitations RESPIRATORY: No cough/shortness of breath/wheeze GASTROINTESTINAL: No nausea/vomiting/abdominal pain/blood in stool/diarrhea/constipation MUSCULOSKELETAL: No myalgia/arthralgia, (+)back pain on R GENITOURINARY: No incontinence, No abnormal genital bleeding/discharge, no dysuria/hematuria SKIN: No rash/wounds/concerning lesions   Exam:  BP 134/70 mmHg  Pulse 70  Temp(Src) 98.2 F (36.8 C) (Oral)  Ht 5\' 6"  (1.676 m)  Wt 257 lb (116.574 kg)  BMI 41.50 kg/m2 Constitutional: VSS, see above. General Appearance: alert, well-developed, well-nourished, NAD Respiratory: Normal respiratory effort. Breath sounds normal, no wheeze/rhonchi/rales Cardiovascular: S1/S2 normal, no murmur/rub/gallop auscultated. RRR. No lower extremity edema. Gastrointestinal: Nontender, no masses. No hepatomegaly, no splenomegaly. No hernia appreciated. Rectal exam deferred.  Musculoskeletal: Gait normal. No clubbing/cyanosis of digits. (+) paraspinal tenderness on R thoracic region, muscle tense   Results for orders placed or performed in visit on 10/15/14 (from the past 24 hour(s))  POCT urinalysis dipstick     Status: None   Collection Time: 10/15/14  1:29 PM  Result Value Ref Range   Color, UA yellow    Clarity, UA clear    Glucose, UA neg  Bilirubin, UA neg    Ketones, UA neg    Spec Grav, UA 1.015    Blood, UA neg    pH, UA 7.0    Protein, UA neg    Urobilinogen, UA 0.2    Nitrite, UA neg    Leukocytes, UA Negative Negative     ASSESSMENT/PLAN:  Muscular abdominal pain in right flank - Plan: cyclobenzaprine (FLEXERIL) 5 MG tablet  Right-sided thoracic back pain - Plan: meloxicam  (MOBIC) 7.5 MG tablet  Right flank pain - Plan: POCT urinalysis dipstick - normal   If no improvement RTC for treatment or call for PT referral

## 2014-10-16 ENCOUNTER — Other Ambulatory Visit: Payer: Self-pay

## 2014-10-16 DIAGNOSIS — M546 Pain in thoracic spine: Secondary | ICD-10-CM

## 2014-10-16 DIAGNOSIS — M7918 Myalgia, other site: Secondary | ICD-10-CM

## 2014-10-16 DIAGNOSIS — R109 Unspecified abdominal pain: Principal | ICD-10-CM

## 2014-10-16 MED ORDER — MELOXICAM 7.5 MG PO TABS: 7.5000 mg | ORAL_TABLET | Freq: Two times a day (BID) | ORAL | Status: DC | PRN

## 2014-10-16 MED ORDER — CYCLOBENZAPRINE HCL 5 MG PO TABS: 5.0000 mg | ORAL_TABLET | Freq: Three times a day (TID) | ORAL | Status: DC | PRN

## 2014-10-23 ENCOUNTER — Other Ambulatory Visit: Payer: Self-pay | Admitting: Family Medicine

## 2014-11-12 ENCOUNTER — Ambulatory Visit: Payer: Commercial Managed Care - HMO | Admitting: Family Medicine

## 2014-12-01 ENCOUNTER — Ambulatory Visit: Payer: Commercial Managed Care - HMO | Admitting: Family Medicine

## 2014-12-07 ENCOUNTER — Ambulatory Visit (INDEPENDENT_AMBULATORY_CARE_PROVIDER_SITE_OTHER): Payer: Commercial Managed Care - HMO | Admitting: Family Medicine

## 2014-12-07 ENCOUNTER — Encounter: Payer: Self-pay | Admitting: Family Medicine

## 2014-12-07 VITALS — BP 113/75 | HR 67 | Temp 99.2°F | Ht 66.0 in | Wt 255.0 lb

## 2014-12-07 DIAGNOSIS — Z23 Encounter for immunization: Secondary | ICD-10-CM

## 2014-12-07 DIAGNOSIS — E7800 Pure hypercholesterolemia, unspecified: Secondary | ICD-10-CM

## 2014-12-07 DIAGNOSIS — E032 Hypothyroidism due to medicaments and other exogenous substances: Secondary | ICD-10-CM | POA: Diagnosis not present

## 2014-12-07 DIAGNOSIS — E739 Lactose intolerance, unspecified: Secondary | ICD-10-CM

## 2014-12-07 LAB — POCT GLYCOSYLATED HEMOGLOBIN (HGB A1C): Hemoglobin A1C: 6.1

## 2014-12-07 MED ORDER — HYDROCHLOROTHIAZIDE 25 MG PO TABS: 25.0000 mg | ORAL_TABLET | Freq: Every day | ORAL | Status: DC

## 2014-12-07 NOTE — Progress Notes (Signed)
   Subjective:    Patient ID: Penny Bauer, female    DOB: 1950/07/27, 64 y.o.   MRN: [id number]  HPI  IFG - No inc thrist or urination. Has been trying to eat better. Hasn't been as consistant.  .    Hypothyroid  - No changesin weight or hair or skin.  Taking her medications regularly.    Hurt her left knee. Was cutting some small trees on a hill an dslid down a hill. She is not sure if landed on her knee. Says has been painful since the day after.  Feels like it might "pop". More bothersome to twist her knee.  Using Aleve or IBU.     Review of Systems     Objective:   Physical Exam  Constitutional: She is oriented to person, place, and time. She appears well-developed and well-nourished.  HENT:  Head: Normocephalic and atraumatic.  Neck: No thyromegaly present.  Cardiovascular: Normal rate, regular rhythm and normal heart sounds.   Pulmonary/Chest: Effort normal and breath sounds normal.  Lymphadenopathy:    She has no cervical adenopathy.  Neurological: She is alert and oriented to person, place, and time.  Skin: Skin is warm and dry.  Psychiatric: She has a normal mood and affect. Her behavior is normal.   Left knee with normal flexion and extension. Strength is 5 out of 5. Nontender along the lateral joint line. She's probably tender along the medial joint line and just below that over the past anserine bursa. No increased laxity.       Assessment & Plan:  IFG - stable. F/u in 6 months.  We discussed potentially going on metformin at this stage to reduce the risk of transitioning into diabetes in the future. She will think about it.  Hypothyroid - doing well on current regimen.  Left knee pain - most consistant with pes anserinr bursitis - and not given on exercises and stretches to do on her own at home. If not improving over the next couple weeks and consider x-ray for formal evaluation.

## 2014-12-07 NOTE — Patient Instructions (Addendum)
Recommend using My [employer]itness [employer] - smart phone app Recommend trial of metformin for weight control and to improve metabolism of glucose.

## 2015-01-01 DIAGNOSIS — H524 Presbyopia: Secondary | ICD-10-CM | POA: Diagnosis not present

## 2015-01-01 DIAGNOSIS — H5210 Myopia, unspecified eye: Secondary | ICD-10-CM | POA: Diagnosis not present

## 2015-01-15 DIAGNOSIS — E739 Lactose intolerance, unspecified: Secondary | ICD-10-CM | POA: Diagnosis not present

## 2015-01-15 DIAGNOSIS — E032 Hypothyroidism due to medicaments and other exogenous substances: Secondary | ICD-10-CM | POA: Diagnosis not present

## 2015-01-15 DIAGNOSIS — E7800 Pure hypercholesterolemia, unspecified: Secondary | ICD-10-CM | POA: Diagnosis not present

## 2015-01-16 LAB — COMPLETE METABOLIC PANEL WITH GFR
ALBUMIN: 3.8 g/dL (ref 3.6–5.1)
ALK PHOS: 80 U/L (ref 33–130)
ALT: 16 U/L (ref 6–29)
AST: 15 U/L (ref 10–35)
BILIRUBIN TOTAL: 0.7 mg/dL (ref 0.2–1.2)
BUN: 18 mg/dL (ref 7–25)
CO2: 32 mmol/L — AB (ref 20–31)
Calcium: 9.2 mg/dL (ref 8.6–10.4)
Chloride: 101 mmol/L (ref 98–110)
Creat: 0.62 mg/dL (ref 0.50–0.99)
Glucose, Bld: 119 mg/dL — ABNORMAL HIGH (ref 65–99)
POTASSIUM: 4 mmol/L (ref 3.5–5.3)
Sodium: 141 mmol/L (ref 135–146)
TOTAL PROTEIN: 6.7 g/dL (ref 6.1–8.1)

## 2015-01-16 LAB — TSH: TSH: 1.776 u[IU]/mL (ref 0.350–4.500)

## 2015-01-16 LAB — LIPID PANEL
Cholesterol: 160 mg/dL (ref 125–200)
HDL: 36 mg/dL — AB (ref >=46)
LDL Cholesterol: 97 mg/dL (ref <130)
TRIGLYCERIDES: 135 mg/dL (ref <150)
Total CHOL/HDL Ratio: 4.4 Ratio (ref <=5.0)
VLDL: 27 mg/dL (ref <30)

## 2015-01-27 ENCOUNTER — Other Ambulatory Visit: Payer: Self-pay | Admitting: Family Medicine

## 2015-05-03 ENCOUNTER — Other Ambulatory Visit: Payer: Self-pay | Admitting: Family Medicine

## 2015-06-08 ENCOUNTER — Ambulatory Visit: Payer: Commercial Managed Care - HMO | Admitting: Family Medicine

## 2015-06-17 ENCOUNTER — Ambulatory Visit: Payer: Commercial Managed Care - HMO | Admitting: Family Medicine

## 2015-06-29 DIAGNOSIS — Z1231 Encounter for screening mammogram for malignant neoplasm of breast: Secondary | ICD-10-CM | POA: Diagnosis not present

## 2015-06-29 LAB — HM MAMMOGRAPHY

## 2015-06-30 ENCOUNTER — Encounter: Payer: Self-pay | Admitting: Family Medicine

## 2015-06-30 ENCOUNTER — Ambulatory Visit (INDEPENDENT_AMBULATORY_CARE_PROVIDER_SITE_OTHER): Payer: Commercial Managed Care - HMO | Admitting: Family Medicine

## 2015-06-30 VITALS — BP 121/57 | HR 73 | Ht 67.0 in | Wt 251.0 lb

## 2015-06-30 DIAGNOSIS — Z1159 Encounter for screening for other viral diseases: Secondary | ICD-10-CM | POA: Diagnosis not present

## 2015-06-30 DIAGNOSIS — E032 Hypothyroidism due to medicaments and other exogenous substances: Secondary | ICD-10-CM

## 2015-06-30 DIAGNOSIS — E739 Lactose intolerance, unspecified: Secondary | ICD-10-CM

## 2015-06-30 DIAGNOSIS — R0789 Other chest pain: Secondary | ICD-10-CM

## 2015-06-30 DIAGNOSIS — R0683 Snoring: Secondary | ICD-10-CM

## 2015-06-30 MED ORDER — HYDROCHLOROTHIAZIDE 25 MG PO TABS: 25.0000 mg | ORAL_TABLET | Freq: Every day | ORAL | Status: DC

## 2015-06-30 MED ORDER — METOPROLOL TARTRATE 100 MG PO TABS: 100.0000 mg | ORAL_TABLET | Freq: Every day | ORAL | Status: DC

## 2015-06-30 MED ORDER — LEVOTHYROXINE SODIUM 150 MCG PO TABS: ORAL_TABLET | ORAL | Status: DC

## 2015-06-30 NOTE — Progress Notes (Signed)
Subjective:    CC: thyroid  HPI:  IFG - No inc thirst or urination.She has lost 4 pounds since I last saw her 6 months ago but that she wasn't eating the best when she was recently staying in the hospital with her husband who was in the ICU.  Hypothyroid- no Recent skin or hair changes. No significant weight changes.  She also complains of some recent chest pain. It actually seems to have occurred mostly at night and even woke her up a couple of times. She missed felt like she was gasping a couple times. She does snore and wonders if she could have some sleep apnea. She just had some twinges and discomfort. She's not sure if it's more musculoskeletal or cardiac. She does have a family history in her father who had heart failure. She is a 5 sleeper.  Past medical history, Surgical history, Family history not pertinant except as noted below, Social history, Allergies, and medications have been entered into the medical record, reviewed, and corrections made.   Review of Systems: No fevers, chills, night sweats, weight loss, chest pain, or shortness of breath.   Objective:    General: Well Developed, well nourished, and in no acute distress.  Neuro: Alert and oriented x3, extra-ocular muscles intact, sensation grossly intact.  HEENT: Normocephalic, atraumatic  Skin: Warm and dry, no rashes. Cardiac: Regular rate and rhythm, no murmurs rubs or gallops, no lower extremity edema. Mild mid chest wall tenderness.   Respiratory: Clear to auscultation bilaterally. Not using accessory muscles, speaking in full sentences.   Impression and Recommendations:   IFG - Hemoglobin A1c of 6.2 today. Fairly stable from the last 6 months. Continue work on diet and exercise and follow-up in 6 months.  Hypothyroid - will plan to recheck level in 6 months and just check yearly since normally her levels are extremely stable.  Atypical chest pain-we'll go ahead and get an EKG today as well as complete his  stopping questionnaire.  EKG shows Rate of 71 peak per minute, normal sinus rhythm with normal axis and no acute ST-T wave changes.  STOP BANG questionnaire score of 3 which makes her high risk for possible sleep apnea. I would recommend that we get her further evaluated with a sleep study.

## 2015-07-02 ENCOUNTER — Other Ambulatory Visit: Payer: Self-pay | Admitting: *Deleted

## 2015-07-02 NOTE — Telephone Encounter (Signed)
Error

## 2015-07-06 ENCOUNTER — Encounter: Payer: Self-pay | Admitting: Family Medicine

## 2015-07-26 ENCOUNTER — Ambulatory Visit (HOSPITAL_BASED_OUTPATIENT_CLINIC_OR_DEPARTMENT_OTHER): Payer: Commercial Managed Care - HMO | Attending: Family Medicine | Admitting: Internal Medicine

## 2015-07-26 VITALS — Ht 67.0 in | Wt 250.0 lb

## 2015-07-26 DIAGNOSIS — G4719 Other hypersomnia: Secondary | ICD-10-CM | POA: Diagnosis not present

## 2015-07-26 DIAGNOSIS — G4736 Sleep related hypoventilation in conditions classified elsewhere: Secondary | ICD-10-CM | POA: Insufficient documentation

## 2015-07-26 DIAGNOSIS — G4733 Obstructive sleep apnea (adult) (pediatric): Secondary | ICD-10-CM | POA: Insufficient documentation

## 2015-07-26 DIAGNOSIS — R0683 Snoring: Secondary | ICD-10-CM | POA: Insufficient documentation

## 2015-07-26 DIAGNOSIS — G4710 Hypersomnia, unspecified: Secondary | ICD-10-CM

## 2015-07-26 DIAGNOSIS — R5383 Other fatigue: Secondary | ICD-10-CM

## 2015-07-31 DIAGNOSIS — G4733 Obstructive sleep apnea (adult) (pediatric): Secondary | ICD-10-CM | POA: Diagnosis not present

## 2015-07-31 DIAGNOSIS — R0683 Snoring: Secondary | ICD-10-CM | POA: Diagnosis not present

## 2015-07-31 DIAGNOSIS — G4710 Hypersomnia, unspecified: Secondary | ICD-10-CM | POA: Diagnosis not present

## 2015-07-31 NOTE — Procedures (Signed)
   Patient Name: Penny Bauer, Penny Bauer Date: 07/26/2015 Gender: Female D.O.B: 01-Dec-1950 Age (years): 65 Referring Provider: [name] Height (inches): 3 Interpreting Physician: Baird Lyons MD, ABSM Weight (lbs): 250 RPSGT: Jacolyn Reedy BMI: 39 MRN: [id number] Neck Size: 14.50 CLINICAL INFORMATION Sleep Study Type: unattended HST   Indication for sleep study: Snoring   Epworth Sleepiness Score: 13 SLEEP STUDY TECHNIQUE A multi-channel overnight portable sleep study was performed. The channels recorded were: nasal airflow, thoracic respiratory movement, and oxygen saturation with a pulse oximetry. Snoring was also monitored.  MEDICATIONS Patient self administered medications include: none reported during study night.  SLEEP ARCHITECTURE Patient was studied for 381.1 minutes. The sleep efficiency was 96.1 % and the patient was supine for 8%. The arousal index was 0.0 per hour.  RESPIRATORY PARAMETERS The overall AHI was 14.0 per hour, with a central apnea index of 0.0 per hour. The oxygen nadir was 74% during sleep. Mean saturation 87%. 325 minutes </= 89%   CARDIAC DATA Mean heart rate during sleep was 76.0 bpm.  IMPRESSIONS - Mild obstructive sleep apnea occurred during this study (AHI = 14.0/h). - No significant central sleep apnea occurred during this study (CAI = 0.0/h). - Severe oxygen desaturation was noted during this study (Min O2 = 74%, Mean = 87%, Time </= 89% 325 minutes. - Patient snored .  DIAGNOSIS - Obstructive Sleep Apnea (327.23 [G47.33 ICD-10]) - Nocturnal Hypoxemia (327.26 [G47.36 ICD-10])  RECOMMENDATIONS - Therapeutic CPAP titration to determine optimal pressure required to alleviate sleep disordered breathing. - Positional therapy avoiding supine position during sleep. - Surgical consultation for Uvulopalatopharyngoplasty (UPPP) may be considered. - Oral appliance may be considered. - Sustained low O2 saturation may indicate  underlying cardiopulmonary disease. - Avoid alcohol, sedatives and other CNS depressants that may worsen sleep apnea and disrupt normal sleep architecture. - Sleep hygiene should be reviewed to assess factors that may improve sleep quality. - Weight management and regular exercise should be initiated or continued.  Deneise Lever Diplomate, American Board of Sleep Medicine  ELECTRONICALLY SIGNED ON:  07/31/2015, 9:26 AM [place] PH: [phone]   FX: [phone] [place]

## 2015-09-09 ENCOUNTER — Telehealth: Payer: Self-pay | Admitting: *Deleted

## 2015-09-09 DIAGNOSIS — Z1211 Encounter for screening for malignant neoplasm of colon: Secondary | ICD-10-CM

## 2015-09-09 NOTE — Telephone Encounter (Signed)
Pt called for a referral for GI.   Referral placed.Marland KitchenMarland KitchenAudelia Hives Bauer

## 2015-09-14 DIAGNOSIS — D123 Benign neoplasm of transverse colon: Secondary | ICD-10-CM | POA: Diagnosis not present

## 2015-09-14 DIAGNOSIS — Z86010 Personal history of colonic polyps: Secondary | ICD-10-CM | POA: Diagnosis not present

## 2015-09-14 LAB — HM COLONOSCOPY

## 2015-09-17 ENCOUNTER — Ambulatory Visit (INDEPENDENT_AMBULATORY_CARE_PROVIDER_SITE_OTHER): Payer: Commercial Managed Care - HMO | Admitting: Family Medicine

## 2015-09-17 ENCOUNTER — Encounter: Payer: Self-pay | Admitting: Family Medicine

## 2015-09-17 DIAGNOSIS — Z23 Encounter for immunization: Secondary | ICD-10-CM | POA: Diagnosis not present

## 2015-09-17 NOTE — Progress Notes (Signed)
Pt here for flu shot. Immunization given. Pt tolerated well.Penny Bauer [place]

## 2015-09-20 DIAGNOSIS — Z90710 Acquired absence of both cervix and uterus: Secondary | ICD-10-CM | POA: Diagnosis not present

## 2015-09-20 DIAGNOSIS — E669 Obesity, unspecified: Secondary | ICD-10-CM | POA: Diagnosis not present

## 2015-09-20 DIAGNOSIS — Z08 Encounter for follow-up examination after completed treatment for malignant neoplasm: Secondary | ICD-10-CM | POA: Diagnosis not present

## 2015-09-20 DIAGNOSIS — C541 Malignant neoplasm of endometrium: Secondary | ICD-10-CM | POA: Diagnosis not present

## 2015-09-20 DIAGNOSIS — Z90722 Acquired absence of ovaries, bilateral: Secondary | ICD-10-CM | POA: Diagnosis not present

## 2015-09-20 DIAGNOSIS — Z6839 Body mass index (BMI) 39.0-39.9, adult: Secondary | ICD-10-CM | POA: Diagnosis not present

## 2015-09-20 DIAGNOSIS — Z8542 Personal history of malignant neoplasm of other parts of uterus: Secondary | ICD-10-CM | POA: Diagnosis not present

## 2015-09-20 DIAGNOSIS — Z9079 Acquired absence of other genital organ(s): Secondary | ICD-10-CM | POA: Diagnosis not present

## 2015-09-20 DIAGNOSIS — N907 Vulvar cyst: Secondary | ICD-10-CM | POA: Diagnosis not present

## 2015-09-29 ENCOUNTER — Encounter: Payer: Self-pay | Admitting: Family Medicine

## 2015-10-15 ENCOUNTER — Ambulatory Visit: Payer: Commercial Managed Care - HMO

## 2015-11-13 ENCOUNTER — Other Ambulatory Visit: Payer: Self-pay | Admitting: Family Medicine

## 2015-12-30 ENCOUNTER — Ambulatory Visit: Payer: Commercial Managed Care - HMO | Admitting: Family Medicine

## 2016-01-03 ENCOUNTER — Encounter: Payer: Self-pay | Admitting: Family Medicine

## 2016-01-03 ENCOUNTER — Ambulatory Visit (INDEPENDENT_AMBULATORY_CARE_PROVIDER_SITE_OTHER): Payer: Commercial Managed Care - HMO | Admitting: Family Medicine

## 2016-01-03 VITALS — BP 132/60 | HR 77 | Ht 67.0 in | Wt 249.0 lb

## 2016-01-03 DIAGNOSIS — E7800 Pure hypercholesterolemia, unspecified: Secondary | ICD-10-CM

## 2016-01-03 DIAGNOSIS — R002 Palpitations: Secondary | ICD-10-CM

## 2016-01-03 DIAGNOSIS — E739 Lactose intolerance, unspecified: Secondary | ICD-10-CM | POA: Diagnosis not present

## 2016-01-03 DIAGNOSIS — Z1159 Encounter for screening for other viral diseases: Secondary | ICD-10-CM

## 2016-01-03 DIAGNOSIS — Z23 Encounter for immunization: Secondary | ICD-10-CM | POA: Diagnosis not present

## 2016-01-03 DIAGNOSIS — E032 Hypothyroidism due to medicaments and other exogenous substances: Secondary | ICD-10-CM | POA: Diagnosis not present

## 2016-01-03 DIAGNOSIS — N3941 Urge incontinence: Secondary | ICD-10-CM

## 2016-01-03 LAB — POCT URINALYSIS DIPSTICK
Bilirubin, UA: NEGATIVE
Glucose, UA: NEGATIVE
Ketones, UA: NEGATIVE
LEUKOCYTES UA: NEGATIVE
NITRITE UA: NEGATIVE
PH UA: 5.5
PROTEIN UA: NEGATIVE
RBC UA: NEGATIVE
Spec Grav, UA: 1.02
UROBILINOGEN UA: 0.2

## 2016-01-03 MED ORDER — TOLTERODINE TARTRATE ER 2 MG PO CP24: 2.0000 mg | ORAL_CAPSULE | Freq: Every day | ORAL | 1 refills | Status: DC

## 2016-01-03 MED ORDER — LEVOTHYROXINE SODIUM 150 MCG PO TABS: ORAL_TABLET | ORAL | 3 refills | Status: DC

## 2016-01-03 MED ORDER — HYDROCHLOROTHIAZIDE 25 MG PO TABS: 25.0000 mg | ORAL_TABLET | Freq: Every day | ORAL | 3 refills | Status: DC

## 2016-01-03 MED ORDER — SIMVASTATIN 40 MG PO TABS: 40.0000 mg | ORAL_TABLET | Freq: Every day | ORAL | 3 refills | Status: DC

## 2016-01-03 NOTE — Addendum Note (Signed)
Addended by: Teddy Spike on: 01/03/2016 03:54 PM   Modules accepted: Orders

## 2016-01-03 NOTE — Progress Notes (Signed)
Subjective:    CC: thyroid, BP   HPI:  Hypothyroidism-recent skin or hair changes. No recent weight changes. Energy levels stable. Taking medication regularly on an empty stomach before first meal of the day.  Leg edema-due for refill on hydrochlorothiazide.  Palpitations-due for refill on metoprolol. She is doing well. She is actually taking her husbands old metoprolol.   IFG - Not exercising regularly.  She is trying to watch her diet though.  She also complains of some slight urinary incontinence. She's has been going on for several months. She'll start to leak just a little and then has a hard time making it to the bathroom with it starts. Any dysuria or hematuria or other urinary tract type symptoms. He would like to try medication for it if possible.  Past medical history, Surgical history, Family history not pertinant except as noted below, Social history, Allergies, and medications have been entered into the medical record, reviewed, and corrections made.   Review of Systems: No fevers, chills, night sweats, weight loss, chest pain, or shortness of breath.   Objective:    General: Well Developed, well nourished, and in no acute distress.  Neuro: Alert and oriented x3, extra-ocular muscles intact, sensation grossly intact.  HEENT: Normocephalic, atraumatic  Skin: Warm and dry, no rashes. Cardiac: Regular rate and rhythm, no murmurs rubs or gallops, no lower extremity edema.  Respiratory: Clear to auscultation bilaterally. Not using accessory muscles, speaking in full sentences.   Impression and Recommendations:   IFG - well controlled. A1C of 6.0 today.  Continue to work on diet and exericse.   Palpitations- doing well on metoprolol.   Hypothyroidism- Due to recheck.  Will call with results. RF sent.    Urinary incontinence-we'll collect a urine specimen. Could certainly try generic Detrol for a couple months and see if it's effective. If not then it may be more of a  mechanical issue such as bladder prolapse etc.

## 2016-01-03 NOTE — Addendum Note (Signed)
Addended by: Teddy Spike on: 01/03/2016 05:36 PM   Modules accepted: Orders

## 2016-01-05 DIAGNOSIS — Z0100 Encounter for examination of eyes and vision without abnormal findings: Secondary | ICD-10-CM | POA: Diagnosis not present

## 2016-02-04 ENCOUNTER — Other Ambulatory Visit: Payer: Self-pay

## 2016-02-04 MED ORDER — TOLTERODINE TARTRATE ER 2 MG PO CP24: 2.0000 mg | ORAL_CAPSULE | Freq: Every day | ORAL | 0 refills | Status: DC

## 2016-02-08 DIAGNOSIS — R002 Palpitations: Secondary | ICD-10-CM | POA: Diagnosis not present

## 2016-02-08 DIAGNOSIS — E032 Hypothyroidism due to medicaments and other exogenous substances: Secondary | ICD-10-CM | POA: Diagnosis not present

## 2016-02-08 DIAGNOSIS — Z1159 Encounter for screening for other viral diseases: Secondary | ICD-10-CM | POA: Diagnosis not present

## 2016-02-08 DIAGNOSIS — E7800 Pure hypercholesterolemia, unspecified: Secondary | ICD-10-CM | POA: Diagnosis not present

## 2016-02-08 LAB — LIPID PANEL
CHOL/HDL RATIO: 4.2 ratio (ref <5.0)
CHOLESTEROL: 157 mg/dL (ref <200)
HDL: 37 mg/dL — ABNORMAL LOW (ref >50)
LDL CALC: 91 mg/dL (ref <100)
TRIGLYCERIDES: 146 mg/dL (ref <150)
VLDL: 29 mg/dL (ref <30)

## 2016-02-08 LAB — COMPLETE METABOLIC PANEL WITH GFR
ALT: 19 U/L (ref 6–29)
AST: 13 U/L (ref 10–35)
Albumin: 3.7 g/dL (ref 3.6–5.1)
Alkaline Phosphatase: 84 U/L (ref 33–130)
BUN: 15 mg/dL (ref 7–25)
CALCIUM: 9.2 mg/dL (ref 8.6–10.4)
CHLORIDE: 101 mmol/L (ref 98–110)
CO2: 30 mmol/L (ref 20–31)
Creat: 0.68 mg/dL (ref 0.50–0.99)
GFR, Est Non African American: >89 mL/min (ref >=60)
Glucose, Bld: 130 mg/dL — ABNORMAL HIGH (ref 65–99)
POTASSIUM: 4 mmol/L (ref 3.5–5.3)
Sodium: 140 mmol/L (ref 135–146)
Total Bilirubin: 0.5 mg/dL (ref 0.2–1.2)
Total Protein: 7.1 g/dL (ref 6.1–8.1)

## 2016-02-08 LAB — TSH: TSH: 1.32 mIU/L

## 2016-02-09 LAB — HEPATITIS C ANTIBODY: HCV AB: NEGATIVE

## 2016-07-04 ENCOUNTER — Encounter: Payer: Self-pay | Admitting: Family Medicine

## 2016-07-04 ENCOUNTER — Ambulatory Visit (INDEPENDENT_AMBULATORY_CARE_PROVIDER_SITE_OTHER): Payer: Medicare HMO | Admitting: Family Medicine

## 2016-07-04 VITALS — BP 123/55 | HR 71 | Ht 66.34 in | Wt 251.0 lb

## 2016-07-04 DIAGNOSIS — G4734 Idiopathic sleep related nonobstructive alveolar hypoventilation: Secondary | ICD-10-CM

## 2016-07-04 DIAGNOSIS — R002 Palpitations: Secondary | ICD-10-CM | POA: Diagnosis not present

## 2016-07-04 DIAGNOSIS — N3941 Urge incontinence: Secondary | ICD-10-CM | POA: Insufficient documentation

## 2016-07-04 DIAGNOSIS — G4733 Obstructive sleep apnea (adult) (pediatric): Secondary | ICD-10-CM | POA: Diagnosis not present

## 2016-07-04 DIAGNOSIS — R7301 Impaired fasting glucose: Secondary | ICD-10-CM | POA: Diagnosis not present

## 2016-07-04 LAB — POCT GLYCOSYLATED HEMOGLOBIN (HGB A1C): Hemoglobin A1C: 6.4

## 2016-07-04 MED ORDER — AMBULATORY NON FORMULARY MEDICATION: 0 refills | Status: DC

## 2016-07-04 MED ORDER — METOPROLOL SUCCINATE ER 100 MG PO TB24: 100.0000 mg | ORAL_TABLET | Freq: Every day | ORAL | 1 refills | Status: DC

## 2016-07-04 NOTE — Progress Notes (Addendum)
Subjective:    CC: Impaired fasting glucose, medication follow-up  HPI:  Impaired fasting glucose-no increased thirst or urination. No symptoms consistent with hypoglycemia. Last A1c was 6.1.  Palpitations-currently on metoprolol 100mg  XL  She also wants to discuss and follow-up on the sleep study that she had done back in June 2017. We never followed up on this.  Urge incontinence-she says that the Detrol LA actually worked well to relieve her symptoms but eventually she stopped it because of cost. With the cost her over $100. She thinks it was because of her deductible at the beginning of the year.  Past medical history, Surgical history, Family history not pertinant except as noted below, Social history, Allergies, and medications have been entered into the medical record, reviewed, and corrections made.   Review of Systems: No fevers, chills, night sweats, weight loss, chest pain, or shortness of breath.   Objective:    General: Well Developed, well nourished, and in no acute distress.  Neuro: Alert and oriented x3, extra-ocular muscles intact, sensation grossly intact.  HEENT: Normocephalic, atraumatic  Skin: Warm and dry, no rashes. Cardiac: Regular rate and rhythm, no murmurs rubs or gallops, no lower extremity edema.  Respiratory: Clear to auscultation bilaterally. Not using accessory muscles, speaking in full sentences.   Impression and Recommendations:    IFG - elevated from previous.  A1C 6.4. We discussed getting back on track with diet exercise and weight loss. She admits she's been craving and eating a lot of sweets recently. I will see her back in 3 months to see if we can get the A1c down. Consider starting metformin depending on the next A1c.  Palpitations- dong well on metoprolol.   Obstructive sleep apnea-home sleep study June 2017 confirmed that she does have obstructive sleep apnea. The recommendation was for therapeutic CPAP. Her AHI was 14. No significant  central sleep apnea. And oxygen CT the mean desaturation was noted to approximate 87%. Will order home CPAP set to auto titration between 5-20 mm water pressure. When it oxygen as she also had severe hypoxemia overnight as well. We discussed avoiding alcohol and sedatives. Also discussed weight loss. Avoiding sleeping on her back.  Urge incontinence-encouraged her to call her insurance to see what might be covered under bladder medications. We could certainly consider something different from the Detrol LA fits more cost effective. If it is still covered we can always consider meal order as an cheaper option as well.  Tdap discussed.  She is due. Prescription given so that she can get it at the pharmacy since it's covered under her Medicare part D.

## 2016-09-20 ENCOUNTER — Ambulatory Visit (INDEPENDENT_AMBULATORY_CARE_PROVIDER_SITE_OTHER): Payer: Medicare HMO | Admitting: Family Medicine

## 2016-09-20 ENCOUNTER — Encounter: Payer: Self-pay | Admitting: Family Medicine

## 2016-09-20 DIAGNOSIS — Z23 Encounter for immunization: Secondary | ICD-10-CM

## 2016-09-20 NOTE — Progress Notes (Signed)
Pt here for flu shot. She tolerated well.Penny Bauer [place]

## 2016-10-04 ENCOUNTER — Ambulatory Visit: Payer: Medicare HMO | Admitting: Family Medicine

## 2016-11-01 ENCOUNTER — Ambulatory Visit (INDEPENDENT_AMBULATORY_CARE_PROVIDER_SITE_OTHER): Payer: Medicare HMO | Admitting: Family Medicine

## 2016-11-01 VITALS — BP 128/54 | HR 79 | Ht 66.0 in | Wt 249.0 lb

## 2016-11-01 DIAGNOSIS — R202 Paresthesia of skin: Secondary | ICD-10-CM | POA: Diagnosis not present

## 2016-11-01 DIAGNOSIS — R7301 Impaired fasting glucose: Secondary | ICD-10-CM

## 2016-11-01 DIAGNOSIS — Z1231 Encounter for screening mammogram for malignant neoplasm of breast: Secondary | ICD-10-CM | POA: Diagnosis not present

## 2016-11-01 DIAGNOSIS — E032 Hypothyroidism due to medicaments and other exogenous substances: Secondary | ICD-10-CM

## 2016-11-01 DIAGNOSIS — M79606 Pain in leg, unspecified: Secondary | ICD-10-CM

## 2016-11-01 DIAGNOSIS — M7989 Other specified soft tissue disorders: Secondary | ICD-10-CM

## 2016-11-01 LAB — BASIC METABOLIC PANEL WITH GFR
BUN: 16 mg/dL (ref 7–25)
CO2: 33 mmol/L — ABNORMAL HIGH (ref 20–32)
CREATININE: 0.65 mg/dL (ref 0.50–0.99)
Calcium: 10 mg/dL (ref 8.6–10.4)
Chloride: 99 mmol/L (ref 98–110)
GFR, EST AFRICAN AMERICAN: 106 mL/min/{1.73_m2} (ref >=60)
GFR, Est Non African American: 91 mL/min/{1.73_m2} (ref >=60)
GLUCOSE: 120 mg/dL — AB (ref 65–99)
Potassium: 4.1 mmol/L (ref 3.5–5.3)
SODIUM: 138 mmol/L (ref 135–146)

## 2016-11-01 LAB — HM MAMMOGRAPHY

## 2016-11-01 LAB — POCT GLYCOSYLATED HEMOGLOBIN (HGB A1C): HEMOGLOBIN A1C: 6.3

## 2016-11-01 MED ORDER — METFORMIN HCL ER 500 MG PO TB24: 500.0000 mg | ORAL_TABLET | Freq: Every day | ORAL | 1 refills | Status: DC

## 2016-11-01 MED ORDER — LEVOTHYROXINE SODIUM 150 MCG PO TABS: ORAL_TABLET | ORAL | 3 refills | Status: DC

## 2016-11-01 MED ORDER — HYDROCHLOROTHIAZIDE 25 MG PO TABS: 25.0000 mg | ORAL_TABLET | Freq: Every day | ORAL | 3 refills | Status: DC

## 2016-11-01 NOTE — Progress Notes (Signed)
All labs are normal. 

## 2016-11-01 NOTE — Progress Notes (Signed)
Subjective:    CC: glucose.   HPI:  Impaired fasting glucose-no increased thirst or urination. No symptoms consistent with hypoglycemia.She's been trying to improve her diet somewhat by cutting out snacks and sweets but has still been eating out a lot. She is not currently exercising. She has noticed though that she thinks she could be developing some neuropathy. She feels like there might be some gum stuck underneath the pad of her foot. She says initially it started on her right foot but notes under both feet. Occasionally she'll get a little bit of tingling. It's not painful.  Hypo-thyroidism-she is due for refill on her thyroid medication. Last TSH in January was 1.3 and looked great.  Lower extremities edema-her swelling has been controlled over all. She had some intermittent swelling over the summer because of the heat.  Past medical history, Surgical history, Family history not pertinant except as noted below, Social history, Allergies, and medications have been entered into the medical record, reviewed, and corrections made.   Review of Systems: No fevers, chills, night sweats, weight loss, chest pain, or shortness of breath.   Objective:    General: Well Developed, well nourished, and in no acute distress.  Neuro: Alert and oriented x3, extra-ocular muscles intact, sensation grossly intact.  HEENT: Normocephalic, atraumatic  Skin: Warm and dry, no rashes. Cardiac: Regular rate and rhythm, no murmurs rubs or gallops, no lower extremity edema.  Respiratory: Clear to auscultation bilaterally. Not using accessory muscles, speaking in full sentences.   Impression and Recommendations:    IFG - A1C at 6.3 today.  We discussed options. I think she would really benefit from going ahead and starting metformin to keep her from be coming diabetic. Discussed potential side effects risk and benefits of the medication. We'll start with metformin ER 500 mg daily. Follow-up in 3-4 months. Due for  BMP.  Lower extremities swelling-refilled hydrocodone thiazide today. Due for BMP.  Paresthesias in feet-certainly could be developing some early neuropathy. We'll keep an eye on this. Consider checking for B12 deficiency in the future.  She was to check to see if she might have a tetanus shot on file.  Thinks had done in 2012.

## 2016-11-16 DIAGNOSIS — Z08 Encounter for follow-up examination after completed treatment for malignant neoplasm: Secondary | ICD-10-CM | POA: Diagnosis not present

## 2016-11-16 DIAGNOSIS — Z90722 Acquired absence of ovaries, bilateral: Secondary | ICD-10-CM | POA: Diagnosis not present

## 2016-11-16 DIAGNOSIS — C541 Malignant neoplasm of endometrium: Secondary | ICD-10-CM | POA: Diagnosis not present

## 2016-11-16 DIAGNOSIS — Z800 Family history of malignant neoplasm of digestive organs: Secondary | ICD-10-CM | POA: Diagnosis not present

## 2016-11-16 DIAGNOSIS — Z9079 Acquired absence of other genital organ(s): Secondary | ICD-10-CM | POA: Diagnosis not present

## 2016-11-16 DIAGNOSIS — Z8542 Personal history of malignant neoplasm of other parts of uterus: Secondary | ICD-10-CM | POA: Diagnosis not present

## 2016-11-16 DIAGNOSIS — E669 Obesity, unspecified: Secondary | ICD-10-CM | POA: Diagnosis not present

## 2016-11-16 DIAGNOSIS — Z90710 Acquired absence of both cervix and uterus: Secondary | ICD-10-CM | POA: Diagnosis not present

## 2016-11-16 DIAGNOSIS — Z6839 Body mass index (BMI) 39.0-39.9, adult: Secondary | ICD-10-CM | POA: Diagnosis not present

## 2016-11-17 ENCOUNTER — Encounter: Payer: Self-pay | Admitting: Family Medicine

## 2017-01-10 DIAGNOSIS — H2513 Age-related nuclear cataract, bilateral: Secondary | ICD-10-CM | POA: Diagnosis not present

## 2017-01-10 DIAGNOSIS — H2510 Age-related nuclear cataract, unspecified eye: Secondary | ICD-10-CM | POA: Diagnosis not present

## 2017-01-10 DIAGNOSIS — I10 Essential (primary) hypertension: Secondary | ICD-10-CM | POA: Diagnosis not present

## 2017-01-10 DIAGNOSIS — Z0100 Encounter for examination of eyes and vision without abnormal findings: Secondary | ICD-10-CM | POA: Diagnosis not present

## 2017-01-10 LAB — HM DIABETES EYE EXAM

## 2017-02-27 ENCOUNTER — Ambulatory Visit: Payer: Medicare HMO | Admitting: Family Medicine

## 2017-03-07 ENCOUNTER — Ambulatory Visit (INDEPENDENT_AMBULATORY_CARE_PROVIDER_SITE_OTHER): Payer: Medicare HMO

## 2017-03-07 ENCOUNTER — Encounter: Payer: Self-pay | Admitting: Family Medicine

## 2017-03-07 ENCOUNTER — Ambulatory Visit (INDEPENDENT_AMBULATORY_CARE_PROVIDER_SITE_OTHER): Payer: Medicare HMO | Admitting: Family Medicine

## 2017-03-07 ENCOUNTER — Telehealth: Payer: Self-pay | Admitting: Family Medicine

## 2017-03-07 VITALS — BP 128/51 | HR 72 | Ht 66.0 in | Wt 252.0 lb

## 2017-03-07 DIAGNOSIS — M5137 Other intervertebral disc degeneration, lumbosacral region: Secondary | ICD-10-CM | POA: Diagnosis not present

## 2017-03-07 DIAGNOSIS — M545 Low back pain, unspecified: Secondary | ICD-10-CM

## 2017-03-07 DIAGNOSIS — R0789 Other chest pain: Secondary | ICD-10-CM | POA: Diagnosis not present

## 2017-03-07 DIAGNOSIS — R002 Palpitations: Secondary | ICD-10-CM

## 2017-03-07 DIAGNOSIS — E119 Type 2 diabetes mellitus without complications: Secondary | ICD-10-CM

## 2017-03-07 DIAGNOSIS — M5136 Other intervertebral disc degeneration, lumbar region: Secondary | ICD-10-CM

## 2017-03-07 DIAGNOSIS — G4734 Idiopathic sleep related nonobstructive alveolar hypoventilation: Secondary | ICD-10-CM | POA: Diagnosis not present

## 2017-03-07 DIAGNOSIS — G4733 Obstructive sleep apnea (adult) (pediatric): Secondary | ICD-10-CM

## 2017-03-07 DIAGNOSIS — G8929 Other chronic pain: Secondary | ICD-10-CM

## 2017-03-07 DIAGNOSIS — R7301 Impaired fasting glucose: Secondary | ICD-10-CM | POA: Diagnosis not present

## 2017-03-07 HISTORY — DX: Type 2 diabetes mellitus without complications: E11.9

## 2017-03-07 LAB — POCT GLYCOSYLATED HEMOGLOBIN (HGB A1C): HEMOGLOBIN A1C: 7.3

## 2017-03-07 MED ORDER — AMBULATORY NON FORMULARY MEDICATION: 0 refills | Status: DC

## 2017-03-07 MED ORDER — BLOOD GLUCOSE MONITOR KIT
PACK | 0 refills | Status: DC
Start: 2017-03-07 — End: 2021-11-16

## 2017-03-07 MED ORDER — METFORMIN HCL ER 500 MG PO TB24: 1000.0000 mg | ORAL_TABLET | Freq: Every day | ORAL | 3 refills | Status: DC

## 2017-03-07 NOTE — Progress Notes (Signed)
Subjective:    CC: Glucose and sleep apnea  HPI:  Impaired fasting glucose-no increased thirst or urination. No symptoms consistent with hypoglycemia.  She does not currently exercise regularly.   OSA - she never got her CPAP.  We will place order in June and she had some is going on with her husband this fall that she really did not get a chance to call us back.  She also co of chronic low back pain that radiates just to the left.  It does not radiate down into the buttock or leg area.  She says she just knows it has been flaring more often recently.  It sore particularly if she does a lot or if she stands for extended periods of time.  She does not exercise regularly.  Reports that she is been having some palpitations.  She said she will get these episodes that are brief usually lasting a few seconds where she will feel her heart pound and beat very quickly.  And then it resolves.  It happens may be once every 1-2 weeks.  Sometimes she will have 2 and a very short time but then it may not happen for a couple weeks.  She is occasionally woken up with some tightness and soreness in her chest.  She said that she had a cardiac catheterization back in 2001.  She reports that she had one vessel that was 30% blocked at that time.  She has not had any cardiac workup since then.  Past medical history, Surgical history, Family history not pertinant except as noted below, Social history, Allergies, and medications have been entered into the medical record, reviewed, and corrections made.   Review of Systems: No fevers, chills, night sweats, weight loss, chest pain, or shortness of breath.   Objective:    General: Well Developed, well nourished, and in no acute distress.  Neuro: Alert and oriented x3, extra-ocular muscles intact, sensation grossly intact.  HEENT: Normocephalic, atraumatic  Skin: Warm and dry, no rashes. Cardiac: Regular rate and rhythm, no murmurs rubs or gallops, no lower extremity  edema.  Respiratory: Clear to auscultation bilaterally. Not using accessory muscles, speaking in full sentences.   Impression and Recommendations:     IFG -new diagnosis of diabetes.  Elevated. A1C now 7.3 in the diabetes range.  Prescription also sent for glucometer for her to start taking checking her blood sugars periodically.  Follow-up in 3 months.  Continue work on [employer] and encourage more regular exercise/activity.  OSA - will resend order for CPAP.  Support to get her sleep apnea treated as this does reduce her risk for arrhythmias.  It may in fact also help with her weight loss.  Chronic low back pain with radiation to the left-we will perform x-ray today.  She has not had any imaging done probably in about 10 years.  She will probably benefit from physical therapy but will start with plain film first.  Okay to use anti-inflammatory as needed.  Palpitations/atypical chest pain-we will refer to cardiology for further workup and evaluation.  She likely will need a stress test and may be even a heart monitor.  Also encouraged her to continue to work on healthy diet weight loss and moderate exercise.

## 2017-03-07 NOTE — Telephone Encounter (Signed)
Faxed order, script, insurance, and Sleep Study and ov notes to Darnelle Bos with [facility] [employer] [phone] and Ph [phone] and requested they schedule with patient ASAP

## 2017-03-08 ENCOUNTER — Telehealth: Payer: Self-pay

## 2017-03-08 MED ORDER — TRIAMCINOLONE ACETONIDE 0.5 % EX OINT: 1.0000 "application " | TOPICAL_OINTMENT | Freq: Two times a day (BID) | CUTANEOUS | 0 refills | Status: DC

## 2017-03-08 NOTE — Telephone Encounter (Signed)
Penny Bauer called and left a message stating the pharmacy didn't receive the ointment. Please advise.

## 2017-03-08 NOTE — Telephone Encounter (Signed)
Patient advised.

## 2017-03-08 NOTE — Telephone Encounter (Signed)
Okay, please tell her I am sorry about that.  Prescription sent this morning.

## 2017-03-13 ENCOUNTER — Telehealth: Payer: Self-pay | Admitting: *Deleted

## 2017-03-13 NOTE — Telephone Encounter (Signed)
Spoke with Jeneen Rinks from [facility] regarding pt's CPAP/oxygen order.  Pt is actually going to have to get another sleep study (preferably in-lab) because it has been over 1 year since sleep study was performed.

## 2017-03-14 NOTE — Telephone Encounter (Signed)
Call pt: please let her know that they are saying that she will have to have another sleep study. Since she didn't start tx for the sleep apnea with in a year of the study they require a new one.  This may be a medicare guidelline.  Please see if she is will to do another.

## 2017-03-16 NOTE — Telephone Encounter (Signed)
Left VM for Pt to return clinic call.  

## 2017-03-21 NOTE — Telephone Encounter (Signed)
Left VM for Pt to return clinic call.  

## 2017-03-31 ENCOUNTER — Other Ambulatory Visit: Payer: Self-pay | Admitting: Family Medicine

## 2017-03-31 DIAGNOSIS — E7800 Pure hypercholesterolemia, unspecified: Secondary | ICD-10-CM

## 2017-06-05 ENCOUNTER — Ambulatory Visit (INDEPENDENT_AMBULATORY_CARE_PROVIDER_SITE_OTHER): Payer: Medicare HMO | Admitting: Family Medicine

## 2017-06-05 ENCOUNTER — Encounter: Payer: Self-pay | Admitting: Family Medicine

## 2017-06-05 VITALS — BP 118/53 | HR 78 | Ht 66.0 in | Wt 246.0 lb

## 2017-06-05 DIAGNOSIS — M7061 Trochanteric bursitis, right hip: Secondary | ICD-10-CM | POA: Diagnosis not present

## 2017-06-05 DIAGNOSIS — E032 Hypothyroidism due to medicaments and other exogenous substances: Secondary | ICD-10-CM | POA: Diagnosis not present

## 2017-06-05 DIAGNOSIS — M79605 Pain in left leg: Secondary | ICD-10-CM

## 2017-06-05 DIAGNOSIS — E119 Type 2 diabetes mellitus without complications: Secondary | ICD-10-CM

## 2017-06-05 DIAGNOSIS — R29898 Other symptoms and signs involving the musculoskeletal system: Secondary | ICD-10-CM

## 2017-06-05 DIAGNOSIS — M79604 Pain in right leg: Secondary | ICD-10-CM | POA: Diagnosis not present

## 2017-06-05 LAB — POCT GLYCOSYLATED HEMOGLOBIN (HGB A1C): HEMOGLOBIN A1C: 6

## 2017-06-05 NOTE — Progress Notes (Signed)
Subjective:    CC: DM  HPI:  Diabetes - no hypoglycemic events. No wounds or sores that are not healing well. No increased thirst or urination. Checking glucose at home. Taking medications as prescribed without any side effects.  F/U hypothyroidism -taking her medication regularly.  No significant skin hair or weight changes.  She does complain that she is had some aching and weakness in her legs going on for couple months.  Right around the time she started taking her statin daily.  She moved to morning so she can be more consistent with it.  Also complains of pain in her right upper outer hip.  She says it started maybe a month or so ago.  She is noticing it more when she would turn over on that side in bed at night.  But now she is noticing it during the daytime.  She has been taking an occasional anti-inflammatory but tries not to take it too much because of her history of gastric banding.  Past medical history, Surgical history, Family history not pertinant except as noted below, Social history, Allergies, and medications have been entered into the medical record, reviewed, and corrections made.   Review of Systems: No fevers, chills, night sweats, weight loss, chest pain, or shortness of breath.   Objective:    General: Well Developed, well nourished, and in no acute distress.  Neuro: Alert and oriented x3, extra-ocular muscles intact, sensation grossly intact.  HEENT: Normocephalic, atraumatic  Skin: Warm and dry, no rashes. Cardiac: Regular rate and rhythm, no murmurs rubs or gallops, no lower extremity edema.  Respiratory: Clear to auscultation bilaterally. Not using accessory muscles, speaking in full sentences. MSK: Tender over the right greater trochanter bursa.  Hip with normal flexion extension.  Normal internal and external rotation without any discomfort or pain.  Strength at the hip knee and ankle is 5 out of 5 bilaterally.   Impression and Recommendations:    DM -  Well controlled.  Great work.  Hemoglobin A1c down to 6.0.  Now on metformin regularly.  Continue current regimen. Follow up in  3-4 months.    Hypothyroidism -recheck TSH.  Last one was done in January 2018.  Bilateral leg pain and weakness-could be related to her statins that she started taking a little bit more regularly.  I want her to hold it for about 3 weeks just to see if she notices any improvement.  If she does then we can try different statin.  The simvastatin is the only when she is ever actually tried.  And if she does not feel better off the statin then she can restart it and then we can do further work-up for the weakness in her legs.  Right greater trochanteric bursitis-discussed treatment options.  Recommend home therapy and exercise and icing.  If not improving over the next 3 weeks please come back and we can always consider doing an injection.

## 2017-06-05 NOTE — Patient Instructions (Signed)
Okay to stop your statin for the next 3 weeks.  If it does help and we need to change statins please give me a call.  Or if it does not help and we need to do further work-up for your legs then please let me know.

## 2017-06-08 ENCOUNTER — Emergency Department
Admission: EM | Admit: 2017-06-08 | Discharge: 2017-06-08 | Disposition: A | Discharge status: Home / Self Care | Payer: Medicare HMO | Source: Home / Self Care | Attending: Family Medicine | Admitting: Family Medicine

## 2017-06-08 DIAGNOSIS — W57XXXA Bitten or stung by nonvenomous insect and other nonvenomous arthropods, initial encounter: Secondary | ICD-10-CM

## 2017-06-08 DIAGNOSIS — S70361A Insect bite (nonvenomous), right thigh, initial encounter: Secondary | ICD-10-CM | POA: Diagnosis not present

## 2017-06-08 NOTE — ED Triage Notes (Signed)
Right hip, tick embedded x 1-2 days

## 2017-06-08 NOTE — ED Provider Notes (Signed)
[facility]    CSN: [id number] Arrival date & time: 06/08/17  1420     History   Chief Complaint Chief Complaint  Patient presents with  . Tick Removal    HPI [facility] is a 67 y.o. female.   Patient discovered a small embedded tick on her right hip today.  No pain, swelling, or redness at bite site.  She believes the tick has not been present longer than 24 hours.  She feels well without fever, headache, arthralgias, etc.   The history is provided by the patient.    Past Medical History:  Diagnosis Date  . Adenocarcinoma ([place])   . Hyperlipidemia   . Hypertension   . Type II or unspecified type diabetes mellitus without mention of complication, not stated as uncontrolled 01/17/2013    Patient Active Problem List   Diagnosis Date Noted  . Controlled type 2 diabetes mellitus without complication, without long-term current use of insulin ([place]) 03/07/2017  . Urge incontinence of urine 07/04/2016  . Pelvic fluid collection 01/16/2013  . Endometrial carcinoma ([place]) 09/06/2012  . Hypothyroidism 09/05/2012  . Lower leg edema 08/04/2010  . KNEE PAIN, RIGHT 09/16/2009  . PALPITATIONS 09/16/2009  . Iatrogenic hypothyroidism 01/18/2006  . DEFICIENCY, VITAMIN D NOS 01/18/2006  . HYPERCHOLESTEROLEMIA 01/18/2006  . OBESITY NOS 01/18/2006  . IRREGULAR MENSTRUATION 01/18/2006  . TACHYCARDIA, HX OF 01/18/2006    Past Surgical History:  Procedure Laterality Date  . CARDIAC CATHETERIZATION  2001   30% blockage  . CHOLECYSTECTOMY  2003  . LAP-BAND surgery  April 2007  . ROBOTIC ASSISTED LAPAROSCOPIC HYSTERECTOMY AND SALPINGECTOMY  2014  . TUBAL LIGATION  1989    OB History   None      Home Medications    Prior to Admission medications   Medication Sig Start Date End Date Taking? Authorizing Provider  ACCU-CHEK FASTCLIX LANCETS MISC U TO TEST BLOOD SUGAR DAILY 04/20/17   [provider]  ACCU-CHEK GUIDE test strip U TO CHECK BLOOD SUGAR D  03/08/17   [provider]  AMBULATORY NON FORMULARY MEDICATION Medication Name: CPAP and supplies set to auto-titrate 5-10cm water pressure with 2 liter oxygen. Dx: nocturnal hypoxemia and OSA. [employer]. 03/07/17   Hali Marry, MD  aspirin 81 MG tablet Take 81 mg by mouth daily.      [provider]  blood glucose meter kit and supplies KIT Dispense based on patient and insurance preference. Use up to once a day as directed. (FOR ICD-9 250.00, 250.01). 03/07/17   Hali Marry, MD  hydrochlorothiazide (HYDRODIURIL) 25 MG tablet Take 1 tablet (25 mg total) by mouth daily. 11/01/16   Hali Marry, MD  levothyroxine (SYNTHROID, LEVOTHROID) 150 MCG tablet TAKE 1 TABLET EVERY DAY 11/01/16   Hali Marry, MD  metFORMIN (GLUCOPHAGE-XR) 500 MG 24 hr tablet Take 2 tablets (1,000 mg total) by mouth daily with breakfast. 03/07/17   Hali Marry, MD  metoprolol succinate (TOPROL XL) 100 MG 24 hr tablet Take 1 tablet (100 mg total) by mouth daily. Take with or immediately following a meal. 07/04/16   Hali Marry, MD  Multiple Vitamin (MULTIVITAMIN) capsule Take 1 capsule by mouth.    [provider]  simvastatin (ZOCOR) 40 MG tablet TAKE 1 TABLET AT BEDTIME 04/02/17   Hali Marry, MD  triamcinolone ointment (KENALOG) 0.5 % Apply 1 application topically 2 (two) times daily. 03/08/17   Hali Marry, MD    Family  History Family History  Problem Relation Age of Onset  . Pancreatic cancer Mother   . Throat cancer Brother   . Heart attack Father 41  . Hypertension Father   . Stroke Father   . Depression Daughter   . Depression Mother   . Diabetes Unknown        Grandfather  . Liver disease Sister        Fatty liver    Social History Social History   Tobacco Use  . Smoking status: Never Smoker  . Smokeless tobacco: Never Used  Substance Use Topics  . Alcohol use: No  . Drug use: No     Allergies   Patient has  no known allergies.   Review of Systems Review of Systems  Constitutional: Negative for activity change, appetite change, chills, diaphoresis, fatigue and fever.  HENT: Negative.   Eyes: Negative.   Respiratory: Negative.   Cardiovascular: Negative.   Gastrointestinal: Negative.   Genitourinary: Negative.   Musculoskeletal: Negative for arthralgias, joint swelling, myalgias and neck stiffness.  Skin: Negative for rash.  Neurological: Negative for headaches.     Physical Exam Triage Vital Signs ED Triage Vitals [06/08/17 1515]  Enc Vitals Group     BP (!) (P) 146/77     Pulse Rate (P) 75     Resp      Temp (P) 99.1 F (37.3 C)     Temp Source (P) Oral     SpO2 (P) 91 %     Weight      Height      Head Circumference      Peak Flow      Pain Score      Pain Loc      Pain Edu?      Excl. in [place]?    No data found.  Updated Vital Signs BP (!) (P) 146/77 (BP Location: Right Arm)   Pulse (P) 75   Temp (P) 99.1 F (37.3 C) (Oral)   SpO2 (P) 91% Comment: Patient says this is normal  Visual Acuity Right Eye Distance:   Left Eye Distance:   Bilateral Distance:    Right Eye Near:   Left Eye Near:    Bilateral Near:     Physical Exam  Constitutional: She appears well-developed and well-nourished. No distress.  HENT:  Head: Normocephalic.  Eyes: Pupils are equal, round, and reactive to light.  Neck: Neck supple.  Cardiovascular: Normal rate.  Pulmonary/Chest: Effort normal.  Abdominal: There is no tenderness.  Musculoskeletal: She exhibits no edema.       Legs: Small non-engorged dog tick embedded over right hip.  No surrounding erythema, swelling, or tenderness.  Neurological: She is alert.  Skin: Skin is warm and dry. No erythema.  Nursing note and vitals reviewed.    UC Treatments / Results  Labs (all labs ordered are listed, but only abnormal results are displayed) Labs Reviewed - No data to display  EKG None  Radiology No results  found.  Procedures Procedures  Tick Removal: Discussed benefits and risks of procedure and verbal consent obtained. Using sterile technique and local anesthesia with 1% lidocaine with epinephrine, cleansed area with Betadine and alcohol.  Removed tick with forceps.  Debrided area with #15 blade to remove mouth parts.  Bacitracin and non-stick sterile dressing applied.  Wound precautions explained to patient.     Medications Ordered in UC Medications - No data to display   Initial Impression / Assessment and Plan / UC  Course  I have reviewed the triage vital signs and the nursing notes.  Pertinent labs & imaging results that were available during my care of the patient were reviewed by me and considered in my medical decision making (see chart for details).    No evidence cellulitis.  Minimal chance of tick borne disease:  Small non-engorged dog tick present less than 24 hours.   Final Clinical Impressions(s) / UC Diagnoses   Final diagnoses:  Tick bite, initial encounter     Discharge Instructions     Change dressing daily and apply Bacitracin ointment to wound until healed.  Keep wound clean and dry.  Return for any signs of infection (or follow-up with family doctor):  Increasing redness, swelling, pain, heat, drainage, etc.      ED Prescriptions    None        Kandra Nicolas, MD 06/15/17 1239

## 2017-06-08 NOTE — Discharge Instructions (Addendum)
Change dressing daily and apply Bacitracin ointment to wound until healed.  Keep wound clean and dry.  Return for any signs of infection (or follow-up with family doctor):  Increasing redness, swelling, pain, heat, drainage, etc.   

## 2017-06-11 NOTE — Progress Notes (Signed)
Referring-Catherine Metheney MD Reason for referral-chest pain  HPI: 67 year old female for evaluation of palpitations at request of [name] MD. Patient had a cardiac evaluation in 2001 for palpitations.  She had a treadmill that showed ST changes in question short run of atrial fibrillation with aberrancy.  She underwent cardiac catheterization that revealed nonobstructive coronary disease and normal LV function.  She was treated with Toprol long-term.  She has noticed recently recurrent palpitations.  They are described as her heart pounding with heart rate of approximately 120.  No associated dyspnea, dizziness or syncope.  She has an aching feeling in her chest.  Symptoms last 5 to 7 minutes and resolve spontaneously.  No precipitating factors.  She otherwise has some dyspnea with activities but no orthopnea or PND.  Occasional mild pedal edema.  No exertional chest pain.  Because of the above cardiology asked to evaluate.  Current Outpatient Medications  Medication Sig Dispense Refill  . ACCU-CHEK FASTCLIX LANCETS MISC U TO TEST BLOOD SUGAR DAILY  0  . ACCU-CHEK GUIDE test strip U TO CHECK BLOOD SUGAR D  0  . AMBULATORY NON FORMULARY MEDICATION Medication Name: CPAP and supplies set to auto-titrate 5-10cm water pressure with 2 liter oxygen. Dx: nocturnal hypoxemia and OSA. [employer]. 1 vial 0  . aspirin 81 MG tablet Take 81 mg by mouth daily.      Marland Kitchen atorvastatin (LIPITOR) 20 MG tablet Take 1 tablet (20 mg total) by mouth daily. 90 tablet 3  . blood glucose meter kit and supplies KIT Dispense based on patient and insurance preference. Use up to once a day as directed. (FOR ICD-9 250.00, 250.01). 1 each 0  . hydrochlorothiazide (HYDRODIURIL) 25 MG tablet Take 1 tablet (25 mg total) by mouth daily. 90 tablet 3  . levothyroxine (SYNTHROID, LEVOTHROID) 150 MCG tablet TAKE 1 TABLET EVERY DAY 90 tablet 3  . metFORMIN (GLUCOPHAGE-XR) 500 MG 24 hr tablet Take 2 tablets (1,000 mg total) by  mouth daily with breakfast. 180 tablet 3  . metoprolol succinate (TOPROL XL) 100 MG 24 hr tablet Take 1 tablet (100 mg total) by mouth daily. Take with or immediately following a meal. 90 tablet 1  . Multiple Vitamin (MULTIVITAMIN) capsule Take 1 capsule by mouth.    . triamcinolone ointment (KENALOG) 0.5 % Apply 1 application topically 2 (two) times daily. 45 g 0  . simvastatin (ZOCOR) 40 MG tablet TAKE 1 TABLET AT BEDTIME (Patient not taking: Reported on 06/18/2017) 90 tablet 3   No current facility-administered medications for this visit.     Allergies  Allergen Reactions  . Zocor [Simvastatin] Other (See Comments)    Leg cramping     Past Medical History:  Diagnosis Date  . Adenocarcinoma ([place])   . Endometrial cancer ([place])   . Hyperlipidemia   . Hypertension   . Hypothyroid   . Type II or unspecified type diabetes mellitus without mention of complication, not stated as uncontrolled 01/17/2013    Past Surgical History:  Procedure Laterality Date  . CARDIAC CATHETERIZATION  2001   30% blockage  . CHOLECYSTECTOMY  2003  . LAP-BAND surgery  April 2007  . ROBOTIC ASSISTED LAPAROSCOPIC HYSTERECTOMY AND SALPINGECTOMY  2014  . TUBAL LIGATION  1989    Social History   Socioeconomic History  . Marital status: Married    Spouse name: Merry Proud  . Number of children: 3  . Years of education: Not on file  . Highest education level: Not on file  Occupational  History  . Occupation: Therapist, sports    Comment: [employer]   Social Needs  . Financial resource strain: Not on file  . Food insecurity:    Worry: Not on file    Inability: Not on file  . Transportation needs:    Medical: Not on file    Non-medical: Not on file  Tobacco Use  . Smoking status: Never Smoker  . Smokeless tobacco: Never Used  Substance and Sexual Activity  . Alcohol use: Yes    Comment: Rarely  . Drug use: No  . Sexual activity: Not on file  Lifestyle  . Physical activity:    Days per week: Not on  file    Minutes per session: Not on file  . Stress: Not on file  Relationships  . Social connections:    Talks on phone: Not on file    Gets together: Not on file    Attends religious service: Not on file    Active member of club or organization: Not on file    Attends meetings of clubs or organizations: Not on file    Relationship status: Not on file  . Intimate partner violence:    Fear of current or ex partner: Not on file    Emotionally abused: Not on file    Physically abused: Not on file    Forced sexual activity: Not on file  Other Topics Concern  . Not on file  Social History Narrative   Her daughers are Charma Igo and Wells Guiles Huminski. No regular exercise. Husband is disabled.    Family History  Problem Relation Age of Onset  . Pancreatic cancer Mother   . Depression Mother   . Throat cancer Brother   . Heart attack Father 54  . Hypertension Father   . Stroke Father   . Depression Daughter   . Diabetes Unknown        Grandfather  . Liver disease Sister        Fatty liver    ROS: no fevers or chills, productive cough, hemoptysis, dysphasia, odynophagia, melena, hematochezia, dysuria, hematuria, rash, seizure activity, orthopnea, PND, pedal edema, claudication. Remaining systems are negative.  Physical Exam:   Blood pressure 128/82, pulse 77, height 5' 6"  (1.676 m), weight 244 lb (110.7 kg).  General:  Well developed/well nourished in NAD Skin warm/dry Patient not depressed No peripheral clubbing Back-normal HEENT-normal/normal eyelids Neck supple/normal carotid upstroke bilaterally; no bruits; no JVD; no thyromegaly chest - CTA/ normal expansion CV - RRR/normal S1 and S2; no murmurs, rubs or gallops;  PMI nondisplaced Abdomen -NT/ND, no HSM, no mass, + bowel sounds, no bruit 2+ femoral pulses, no bruits Ext-no edema, chords, 2+ DP Neuro-grossly nonfocal  ECG -normal sinus rhythm at a rate of 77.  No ST changes.  Personally reviewed  A/P  1  palpitations-etiology unclear.  I will arrange an echocardiogram to assess LV function.  Schedule event monitor to more fully evaluate.  Continue beta-blocker for now.  2 chest pain-she has an aching sensation when she has her palpitations but does not have exertional chest pain.  Electrocardiogram is normal.  I will not pursue further ischemia evaluation at this point.  3 hypertension-blood pressure is controlled.  Continue present medications.  4 hyperlipidemia-Zocor was recently discontinued because of leg myalgias.  She states there has been a moderate improvement.  It appears that primary care plans to initiate trial of Lipitor.  Given diabetes mellitus she would benefit from a statin long-term.  5  history of nonobstructive coronary disease-continue aspirin.  6 question history of sleep apnea-patient states she becomes hypoxic particularly at night.  We will arrange a pulmonary evaluation for possible sleep apnea.  Kirk Ruths, MD

## 2017-06-13 DIAGNOSIS — R29898 Other symptoms and signs involving the musculoskeletal system: Secondary | ICD-10-CM | POA: Diagnosis not present

## 2017-06-13 DIAGNOSIS — M79604 Pain in right leg: Secondary | ICD-10-CM | POA: Diagnosis not present

## 2017-06-13 DIAGNOSIS — E119 Type 2 diabetes mellitus without complications: Secondary | ICD-10-CM | POA: Diagnosis not present

## 2017-06-13 DIAGNOSIS — M79605 Pain in left leg: Secondary | ICD-10-CM | POA: Diagnosis not present

## 2017-06-13 LAB — CBC
HCT: 43.1 % (ref 35.0–45.0)
HEMOGLOBIN: 14.6 g/dL (ref 11.7–15.5)
MCH: 30.2 pg (ref 27.0–33.0)
MCHC: 33.9 g/dL (ref 32.0–36.0)
MCV: 89.2 fL (ref 80.0–100.0)
MPV: 11.2 fL (ref 7.5–12.5)
PLATELETS: 285 10*3/uL (ref 140–400)
RBC: 4.83 10*6/uL (ref 3.80–5.10)
RDW: 12.4 % (ref 11.0–15.0)
WBC: 6.1 10*3/uL (ref 3.8–10.8)

## 2017-06-13 LAB — TSH: TSH: 0.46 m[IU]/L (ref 0.40–4.50)

## 2017-06-13 LAB — COMPLETE METABOLIC PANEL WITH GFR
AG Ratio: 1.2 (calc) (ref 1.0–2.5)
ALKALINE PHOSPHATASE (APISO): 90 U/L (ref 33–130)
ALT: 25 U/L (ref 6–29)
AST: 15 U/L (ref 10–35)
Albumin: 3.9 g/dL (ref 3.6–5.1)
BILIRUBIN TOTAL: 0.5 mg/dL (ref 0.2–1.2)
BUN: 11 mg/dL (ref 7–25)
CHLORIDE: 102 mmol/L (ref 98–110)
CO2: 32 mmol/L (ref 20–32)
Calcium: 9.6 mg/dL (ref 8.6–10.4)
Creat: 0.6 mg/dL (ref 0.50–0.99)
GFR, Est African American: 108 mL/min/{1.73_m2} (ref >=60)
GFR, Est Non African American: 93 mL/min/{1.73_m2} (ref >=60)
GLOBULIN: 3.3 g/dL (ref 1.9–3.7)
GLUCOSE: 122 mg/dL — AB (ref 65–99)
Potassium: 4.3 mmol/L (ref 3.5–5.3)
SODIUM: 142 mmol/L (ref 135–146)
Total Protein: 7.2 g/dL (ref 6.1–8.1)

## 2017-06-13 LAB — LIPID PANEL
CHOL/HDL RATIO: 5.6 (calc) — AB (ref <5.0)
Cholesterol: 219 mg/dL — ABNORMAL HIGH (ref <200)
HDL: 39 mg/dL — ABNORMAL LOW (ref >50)
LDL CHOLESTEROL (CALC): 146 mg/dL — AB
Non-HDL Cholesterol (Calc): 180 mg/dL (calc) — ABNORMAL HIGH (ref <130)
TRIGLYCERIDES: 205 mg/dL — AB (ref <150)

## 2017-06-14 NOTE — Progress Notes (Signed)
Call pt: cholesterol has worsened over the past year. Are you still taking zocor? If so we may need to switch to higher intensity statin. Goal LDL is 70 for people with DM. Yours is 146.   All other labs were unremarkable.

## 2017-06-17 MED ORDER — ATORVASTATIN CALCIUM 20 MG PO TABS: 20.0000 mg | ORAL_TABLET | Freq: Every day | ORAL | 3 refills | Status: DC

## 2017-06-17 NOTE — Addendum Note (Signed)
Addended by: [name] D on: 06/17/2017 01:17 PM   Modules accepted: Orders

## 2017-06-18 ENCOUNTER — Ambulatory Visit: Payer: Medicare HMO | Admitting: Cardiology

## 2017-06-18 ENCOUNTER — Encounter: Payer: Self-pay | Admitting: Cardiology

## 2017-06-18 VITALS — BP 128/82 | HR 77 | Ht 66.0 in | Wt 244.0 lb

## 2017-06-18 DIAGNOSIS — R002 Palpitations: Secondary | ICD-10-CM

## 2017-06-18 DIAGNOSIS — R0609 Other forms of dyspnea: Secondary | ICD-10-CM

## 2017-06-18 DIAGNOSIS — G4730 Sleep apnea, unspecified: Secondary | ICD-10-CM | POA: Diagnosis not present

## 2017-06-18 DIAGNOSIS — R0789 Other chest pain: Secondary | ICD-10-CM | POA: Diagnosis not present

## 2017-06-18 DIAGNOSIS — R0902 Hypoxemia: Secondary | ICD-10-CM

## 2017-06-18 NOTE — Patient Instructions (Signed)
Medication Instructions:  Your physician recommends that you continue on your current medications as directed. Please refer to the Current Medication list given to you today.  Labwork: None   Testing/Procedures: Your physician has requested that you have an echocardiogram. Echocardiography is a painless test that uses sound waves to create images of your heart. It provides your doctor with information about the size and shape of your heart and how well your heart's chambers and valves are working. This procedure takes approximately one hour. There are no restrictions for this procedure. [place] has recommended that you wear an 30 day event monitor. Event monitors are medical devices that record the heart's electrical activity. Doctors most often [place] these monitors to diagnose arrhythmias. Arrhythmias are problems with the speed or rhythm of the heartbeat. The monitor is a small, portable device. You can wear one while you do your normal daily activities. This is usually used to diagnose what is causing palpitations/syncope (passing out). [place]: Your physician recommends that you schedule a follow-up appointment in: [place] OFFICE  Any Other Special Instructions Will Be Listed Below (If Applicable). REFERRAL TO PULMONARY    If you need a refill on your cardiac medications before your next appointment, please call your pharmacy.

## 2017-06-20 ENCOUNTER — Ambulatory Visit (HOSPITAL_COMMUNITY): Payer: Medicare HMO | Attending: Cardiology

## 2017-06-20 ENCOUNTER — Other Ambulatory Visit: Payer: Self-pay

## 2017-06-20 DIAGNOSIS — R002 Palpitations: Secondary | ICD-10-CM | POA: Insufficient documentation

## 2017-06-20 DIAGNOSIS — E119 Type 2 diabetes mellitus without complications: Secondary | ICD-10-CM | POA: Diagnosis not present

## 2017-06-20 DIAGNOSIS — E785 Hyperlipidemia, unspecified: Secondary | ICD-10-CM | POA: Insufficient documentation

## 2017-06-20 DIAGNOSIS — R0609 Other forms of dyspnea: Secondary | ICD-10-CM | POA: Insufficient documentation

## 2017-06-20 DIAGNOSIS — I10 Essential (primary) hypertension: Secondary | ICD-10-CM | POA: Insufficient documentation

## 2017-06-20 DIAGNOSIS — R0789 Other chest pain: Secondary | ICD-10-CM | POA: Insufficient documentation

## 2017-06-20 MED ORDER — PERFLUTREN LIPID MICROSPHERE
1.0000 mL | INTRAVENOUS | Status: AC | PRN
  Administered 2017-06-20: 2 mL via INTRAVENOUS

## 2017-07-06 MED FILL — Perflutren Lipid Microsphere IV Susp 6.52 MG/ML: INTRAVENOUS | Qty: 2 | Status: AC

## 2017-07-30 ENCOUNTER — Ambulatory Visit (INDEPENDENT_AMBULATORY_CARE_PROVIDER_SITE_OTHER): Payer: Medicare HMO

## 2017-07-30 DIAGNOSIS — R0609 Other forms of dyspnea: Secondary | ICD-10-CM | POA: Diagnosis not present

## 2017-07-30 DIAGNOSIS — R0789 Other chest pain: Secondary | ICD-10-CM

## 2017-07-30 DIAGNOSIS — R002 Palpitations: Secondary | ICD-10-CM | POA: Diagnosis not present

## 2017-08-13 ENCOUNTER — Encounter: Payer: Self-pay | Admitting: Pulmonary Disease

## 2017-08-13 ENCOUNTER — Ambulatory Visit: Payer: Medicare HMO | Admitting: Pulmonary Disease

## 2017-08-13 VITALS — BP 130/70 | HR 82 | Ht 66.5 in | Wt 242.1 lb

## 2017-08-13 DIAGNOSIS — G4733 Obstructive sleep apnea (adult) (pediatric): Secondary | ICD-10-CM

## 2017-08-13 NOTE — Patient Instructions (Signed)
Obstructive sleep apnea: We will reorder a home sleep test because your insurance company tells [place] we have to Once that test re-confirms that you have obstructive sleep apnea then we can order a CPAP machine to help treat this condition In the meantime, I would try to lose more weight if possible I would cut back on caffeine as well  Nocturnal hypoxemia: We should be able to address this with CPAP, once we get you a machine we can retest your oxygen level to make sure that you do not need supplemental oxygen with it as well.  We will see you back in 4 weeks to go over the results of the sleep study

## 2017-08-13 NOTE — Progress Notes (Signed)
Synopsis: Referred in July 2019 for Obstructive sleep apnea. Has been noted to have paroxysmal Afib on a treadmill test in the past.   Subjective:   PATIENT ID: Penny Bauer GENDER: female DOB: [birth year], MRN: [id number]   HPI  Chief Complaint  Patient presents with  . pulmonary consult    referred by Dr. Stanford Breed for breathing issues   This is a pleasant 67 year old female who comes to my clinic today for evaluation of possible sleep apnea.  She says that she never smoked cigarettes and she was never told in the past that she had a lung problem.  Specifically she says she never had asthma or pneumonia as a child.  However, she was noted to have some daytime sleepiness several years ago and her husband witnessed apneas while sleeping.  So she had a home sleep study which showed evidence of obstructive sleep apnea as well as nocturnal hypoxemia.  Her primary care physician ordered CPAP but the patient was never able to get the machine because she was too busy taking care of her husband who is dealing with some fairly serious heart issues at the time.  Recently she was seen by cardiology because of some palpitations and feeling pressure in her chest and Dr. Stanford Breed noted that the patient had a history of sleep apnea which was untreated so he referred her to [place] for further evaluation.  She tells me that she has some trouble sleeping and typically goes to bed around 2 AM.  She does this mostly because it is the only time of day that she can have some time for herself before going to bed.  She says that she drinks caffeinated beverages throughout the course of the day and has innumerable glasses of tea after having 2 cups of coffee in the morning.  Past Medical History:  Diagnosis Date  . Adenocarcinoma ([place])   . Endometrial cancer ([place])   . Hyperlipidemia   . Hypertension   . Hypothyroid   . Type II or unspecified type diabetes mellitus without mention of complication, not stated as  uncontrolled 01/17/2013     Family History  Problem Relation Age of Onset  . Pancreatic cancer Mother   . Depression Mother   . Throat cancer Brother   . Heart attack Father 92  . Hypertension Father   . Stroke Father   . Depression Daughter   . Diabetes Unknown        Grandfather  . Liver disease Sister        Fatty liver     Social History   Socioeconomic History  . Marital status: Married    Spouse name: Merry Proud  . Number of children: 3  . Years of education: Not on file  . Highest education level: Not on file  Occupational History  . Occupation: Therapist, sports    Comment: [employer]   Social Needs  . Financial resource strain: Not on file  . Food insecurity:    Worry: Not on file    Inability: Not on file  . Transportation needs:    Medical: Not on file    Non-medical: Not on file  Tobacco Use  . Smoking status: Never Smoker  . Smokeless tobacco: Never Used  Substance and Sexual Activity  . Alcohol use: Yes    Comment: Rarely  . Drug use: No  . Sexual activity: Not on file  Lifestyle  . Physical activity:    Days per week: Not on file  Minutes per session: Not on file  . Stress: Not on file  Relationships  . Social connections:    Talks on phone: Not on file    Gets together: Not on file    Attends religious service: Not on file    Active member of club or organization: Not on file    Attends meetings of clubs or organizations: Not on file    Relationship status: Not on file  . Intimate partner violence:    Fear of current or ex partner: Not on file    Emotionally abused: Not on file    Physically abused: Not on file    Forced sexual activity: Not on file  Other Topics Concern  . Not on file  Social History Narrative   Her daughers are Charma Igo and Wells Guiles Huminski. No regular exercise. Husband is disabled.     Allergies  Allergen Reactions  . Zocor [Simvastatin] Other (See Comments)    Leg cramping     Outpatient Medications Prior to  Visit  Medication Sig Dispense Refill  . ACCU-CHEK FASTCLIX LANCETS MISC U TO TEST BLOOD SUGAR DAILY  0  . ACCU-CHEK GUIDE test strip U TO CHECK BLOOD SUGAR D  0  . AMBULATORY NON FORMULARY MEDICATION Medication Name: CPAP and supplies set to auto-titrate 5-10cm water pressure with 2 liter oxygen. Dx: nocturnal hypoxemia and OSA. [employer]oc[employer]. 1 vial 0  . aspirin 81 MG tablet Take 81 mg by mouth daily.      Marland Kitchen atorvastatin (LIPITOR) 20 MG tablet Take 1 tablet (20 mg total) by mouth daily. 90 tablet 3  . blood glucose meter kit and supplies KIT Dispense based on patient and insurance preference. Use up to once a day as directed. (FOR ICD-9 250.00, 250.01). 1 each 0  . hydrochlorothiazide (HYDRODIURIL) 25 MG tablet Take 1 tablet (25 mg total) by mouth daily. 90 tablet 3  . levothyroxine (SYNTHROID, LEVOTHROID) 150 MCG tablet TAKE 1 TABLET EVERY DAY 90 tablet 3  . metFORMIN (GLUCOPHAGE-XR) 500 MG 24 hr tablet Take 2 tablets (1,000 mg total) by mouth daily with breakfast. 180 tablet 3  . metoprolol succinate (TOPROL XL) 100 MG 24 hr tablet Take 1 tablet (100 mg total) by mouth daily. Take with or immediately following a meal. 90 tablet 1  . Multiple Vitamin (MULTIVITAMIN) capsule Take 1 capsule by mouth.    . triamcinolone ointment (KENALOG) 0.5 % Apply 1 application topically 2 (two) times daily. 45 g 0  . simvastatin (ZOCOR) 40 MG tablet TAKE 1 TABLET AT BEDTIME (Patient not taking: Reported on 06/18/2017) 90 tablet 3   No facility-administered medications prior to visit.     Review of Systems  Constitutional: Negative for chills, fever, malaise/fatigue and weight loss.  HENT: Negative for congestion, nosebleeds, sinus pain and sore throat.   Eyes: Negative for photophobia, pain and discharge.  Respiratory: Negative for cough, hemoptysis, sputum production, shortness of breath and wheezing.   Cardiovascular: Negative for chest pain, palpitations, orthopnea and leg swelling.  Gastrointestinal:  Negative for abdominal pain, constipation, diarrhea, nausea and vomiting.  Genitourinary: Negative for dysuria, frequency, hematuria and urgency.  Musculoskeletal: Negative for back pain, joint pain, myalgias and neck pain.  Skin: Negative for itching and rash.  Neurological: Negative for tingling, tremors, sensory change, speech change, focal weakness, seizures, weakness and headaches.  Psychiatric/Behavioral: Negative for memory loss, substance abuse and suicidal ideas. The patient is not nervous/anxious.       Objective:  Physical Exam  Vitals:   08/13/17 1442  BP: 130/70  Pulse: 82  SpO2: 97%  Weight: 242 lb 1.3 oz (109.8 kg)  Height: 5' 6.5" (1.689 m)   RA  Gen: well appearing HENT: OP clear, TM's clear, neck supple PULM: CTA B, normal percussion CV: RRR, no mgr, trace edema GI: BS+, soft, nontender Derm: no cyanosis or rash Psyche: normal mood and affect   CBC    Component Value Date/Time   WBC 6.1 06/13/2017 1109   RBC 4.83 06/13/2017 1109   HGB 14.6 06/13/2017 1109   HCT 43.1 06/13/2017 1109   PLT 285 06/13/2017 1109   MCV 89.2 06/13/2017 1109   MCH 30.2 06/13/2017 1109   MCHC 33.9 06/13/2017 1109   RDW 12.4 06/13/2017 1109     Chest imaging:  PFT:  Labs:  Path:  Echo: May 2019 echocardiogram showed normal LV EF, normal RV size and function, no valvular abnormalities  Sleep study: July 2017 home sleep test AHI 14, O2 saturation nadir 74%, average O2 saturation 87%   Heart Catheterization:       Assessment & Plan:   OSA (obstructive sleep apnea)  Discussion: Nakeita is here to see me today for evaluation of nocturnal hypoxemia and obstructive sleep apnea.  She was diagnosed with this after a polysomnogram was performed in 2017 but has not treated it since then.  I counseled her today that obstructive sleep apnea can be quite dangerous and lead to atrial fibrillation (which can cause palpitations, currently she is undergoing evaluation of  this now).  She is interested in trying CPAP, but her sleep test is out of date.  Plan: Obstructive sleep apnea: We will reorder a home sleep test because your insurance company tells [place] we have to Once that test re-confirms that you have obstructive sleep apnea then we can order a CPAP machine to help treat this condition In the meantime, I would try to lose more weight if possible I would cut back on caffeine as well  Nocturnal hypoxemia: We should be able to address this with CPAP, once we get you a machine we can retest your oxygen level to make sure that you do not need supplemental oxygen with it as well.  We will see you back in 4 weeks to go over the results of the sleep study    Current Outpatient Medications:  .  ACCU-CHEK FASTCLIX LANCETS MISC, U TO TEST BLOOD SUGAR DAILY, Disp: , Rfl: 0 .  ACCU-CHEK GUIDE test strip, U TO CHECK BLOOD SUGAR D, Disp: , Rfl: 0 .  AMBULATORY NON FORMULARY MEDICATION, Medication Name: CPAP and supplies set to auto-titrate 5-10cm water pressure with 2 liter oxygen. Dx: nocturnal hypoxemia and OSA. [employer]., Disp: 1 vial, Rfl: 0 .  aspirin 81 MG tablet, Take 81 mg by mouth daily.  , Disp: , Rfl:  .  atorvastatin (LIPITOR) 20 MG tablet, Take 1 tablet (20 mg total) by mouth daily., Disp: 90 tablet, Rfl: 3 .  blood glucose meter kit and supplies KIT, Dispense based on patient and insurance preference. Use up to once a day as directed. (FOR ICD-9 250.00, 250.01)., Disp: 1 each, Rfl: 0 .  hydrochlorothiazide (HYDRODIURIL) 25 MG tablet, Take 1 tablet (25 mg total) by mouth daily., Disp: 90 tablet, Rfl: 3 .  levothyroxine (SYNTHROID, LEVOTHROID) 150 MCG tablet, TAKE 1 TABLET EVERY DAY, Disp: 90 tablet, Rfl: 3 .  metFORMIN (GLUCOPHAGE-XR) 500 MG 24 hr tablet, Take 2 tablets (1,000 mg total) by mouth daily with  breakfast., Disp: 180 tablet, Rfl: 3 .  metoprolol succinate (TOPROL XL) 100 MG 24 hr tablet, Take 1 tablet (100 mg total) by mouth daily. Take with  or immediately following a meal., Disp: 90 tablet, Rfl: 1 .  Multiple Vitamin (MULTIVITAMIN) capsule, Take 1 capsule by mouth., Disp: , Rfl:  .  triamcinolone ointment (KENALOG) 0.5 %, Apply 1 application topically 2 (two) times daily., Disp: 45 g, Rfl: 0

## 2017-08-27 DIAGNOSIS — G4733 Obstructive sleep apnea (adult) (pediatric): Secondary | ICD-10-CM | POA: Diagnosis not present

## 2017-08-28 ENCOUNTER — Other Ambulatory Visit: Payer: Self-pay | Admitting: *Deleted

## 2017-08-28 DIAGNOSIS — G4733 Obstructive sleep apnea (adult) (pediatric): Secondary | ICD-10-CM

## 2017-09-04 ENCOUNTER — Ambulatory Visit: Payer: Medicare HMO | Admitting: Family Medicine

## 2017-09-06 ENCOUNTER — Ambulatory Visit (INDEPENDENT_AMBULATORY_CARE_PROVIDER_SITE_OTHER): Payer: Medicare HMO | Admitting: Family Medicine

## 2017-09-06 VITALS — BP 123/52 | HR 72 | Ht 67.0 in | Wt 242.0 lb

## 2017-09-06 DIAGNOSIS — E119 Type 2 diabetes mellitus without complications: Secondary | ICD-10-CM | POA: Diagnosis not present

## 2017-09-06 DIAGNOSIS — E7800 Pure hypercholesterolemia, unspecified: Secondary | ICD-10-CM

## 2017-09-06 DIAGNOSIS — E032 Hypothyroidism due to medicaments and other exogenous substances: Secondary | ICD-10-CM | POA: Diagnosis not present

## 2017-09-06 DIAGNOSIS — Z780 Asymptomatic menopausal state: Secondary | ICD-10-CM | POA: Diagnosis not present

## 2017-09-06 DIAGNOSIS — G4733 Obstructive sleep apnea (adult) (pediatric): Secondary | ICD-10-CM

## 2017-09-06 LAB — POCT GLYCOSYLATED HEMOGLOBIN (HGB A1C): HbA1c, POC (controlled diabetic range): 5.9 % (ref 0.0–7.0)

## 2017-09-06 LAB — POCT UA - MICROALBUMIN
Creatinine, POC: 100 mg/dL
Microalbumin Ur, POC: 10 mg/L

## 2017-09-06 MED ORDER — METOPROLOL SUCCINATE ER 100 MG PO TB24: 100.0000 mg | ORAL_TABLET | Freq: Every day | ORAL | 1 refills | Status: DC

## 2017-09-06 NOTE — Progress Notes (Signed)
Subjective:    CC: thyroid, DM  HPI:  Hypothyroidism - Taking medication regularly in the AM away from food and vitamins, etc. No recent change to skin, hair, or energy levels.  Diabetes - no hypoglycemic events. No wounds or sores that are not healing well. No increased thirst or urination. Checking glucose at home. Taking medications as prescribed without any side effects.  F/U hyperlipidemia-she did not tolerate Zocor because of leg cramps so after checking her last lipid panel which was still significantly elevated we decided to try atorvastatin.  She is been tolerating well without any significant side effects.  In fact she feels like her leg weakness is actually better since switching to atorvastatin.  Before she felt like her legs were going to give out now they feel a little stronger but sometimes she will get a little bit of week feeling.  She did see Dr. Lake Bells for evaluation for possible sleep apnea.  She had a normal cardiac monitor.  She still awaiting the results of her sleep test.  She was diagnosed with sleep apnea previously her study was so old that they could not go off of that..  Past medical history, Surgical history, Family history not pertinant except as noted below, Social history, Allergies, and medications have been entered into the medical record, reviewed, and corrections made.   Review of Systems: No fevers, chills, night sweats, weight loss, chest pain, or shortness of breath.   Objective:    General: Well Developed, well nourished, and in no acute distress.  Neuro: Alert and oriented x3, extra-ocular muscles intact, sensation grossly intact.  HEENT: Normocephalic, atraumatic  Skin: Warm and dry, no rashes. Cardiac: Regular rate and rhythm, no murmurs rubs or gallops, no lower extremity edema.  Respiratory: Clear to auscultation bilaterally. Not using accessory muscles, speaking in full sentences.   Impression and Recommendations:    Hypothyroidism - due  to recheck TSH.  TSH was a little bit suppressed at 0.6.  Did not make any adjustments to her regimen as she is been on the same dose for years but just wanted to recheck it to make sure it was not still running a little on the low side.  Diabetes - no hypoglycemic events. No wounds or sores that are not healing well. No increased thirst or urination. Checking glucose at home. Taking medications as prescribed without any side effects.  Cough her most recent eye exam to eye care center in [place].  Well-controlled.  Continue current regimen.  Hyperlipidemia-due to recheck lipid panel, now that she is on atorvastatin instead of simvastatin.  Doing well on the new medication thus far.  OSA -he gets set up on CPAP soon to treat her sleep apnea.  Follow-up with Dr. Simonne Maffucci.  Due for DEXA. Ordered.

## 2017-09-20 ENCOUNTER — Encounter: Payer: Self-pay | Admitting: Pulmonary Disease

## 2017-09-20 ENCOUNTER — Ambulatory Visit (INDEPENDENT_AMBULATORY_CARE_PROVIDER_SITE_OTHER): Payer: Medicare HMO | Admitting: Pulmonary Disease

## 2017-09-20 VITALS — BP 116/76 | HR 73 | Ht 67.0 in | Wt 242.0 lb

## 2017-09-20 DIAGNOSIS — G4733 Obstructive sleep apnea (adult) (pediatric): Secondary | ICD-10-CM | POA: Diagnosis not present

## 2017-09-20 DIAGNOSIS — G4734 Idiopathic sleep related nonobstructive alveolar hypoventilation: Secondary | ICD-10-CM | POA: Diagnosis not present

## 2017-09-20 NOTE — Progress Notes (Signed)
Synopsis: Referred in July 2019 for Obstructive sleep apnea. Has been noted to have paroxysmal Afib on a treadmill test in the past.   Subjective:   PATIENT ID: Penny Bauer GENDER: female DOB: August 24, 1950, MRN: [id number]   HPI  Chief Complaint  Patient presents with  . Follow-up    1 month follow up to discuss HST results. Patient denies any other sleeping/breathing issues.    Harmoni is here to follow-up on the results of her home sleep study.  She continues to have some sleepiness during the daytime.  She had a home sleep study which showed obstructive sleep apnea.  Her O2 saturation dropped to about 80% though the average was 87%.  Her AHI was 12.  She is here to discuss various options for treatment.  Past Medical History:  Diagnosis Date  . Adenocarcinoma ([place])   . Controlled type 2 diabetes mellitus without complication, without long-term current use of insulin ([place]) 03/07/2017  . DEFICIENCY, VITAMIN D NOS 01/18/2006   Qualifier: Diagnosis of  By: Madilyn Fireman MD, Barnetta Chapel    . Endometrial cancer ([place])   . Endometrial carcinoma ([place]) 09/06/2012   Endometrial biopsy performed on 08/27/2012. Final pathology revealed a patchy a grade 2 endometrial adenocarcinoma. She was referred to Dr. Genia Del at [facility].   Marland Kitchen HYPERCHOLESTEROLEMIA 01/18/2006   Qualifier: Diagnosis of  By: Madilyn Fireman MD, Barnetta Chapel    . Hyperlipidemia   . Hypertension   . Hypothyroid   . Hypothyroidism 09/05/2012  . Iatrogenic hypothyroidism 01/18/2006   Qualifier: Diagnosis of  By: Madilyn Fireman MD, Barnetta Chapel    . Lower leg edema 08/04/2010   On hctz for this(not for blood pressure).    . Palpitations 09/16/2009   Qualifier: Diagnosis of  By: Madilyn Fireman MD, Barnetta Chapel    . TACHYCARDIA, HX OF 01/18/2006   Qualifier: Diagnosis of  By: Madilyn Fireman MD, Barnetta Chapel    . Type II or unspecified type diabetes mellitus without mention of complication, not stated as uncontrolled 01/17/2013       Review of Systems    Constitutional: Negative for chills, fever, malaise/fatigue and weight loss.  HENT: Negative for congestion, nosebleeds, sinus pain and sore throat.   Eyes: Negative for photophobia, pain and discharge.  Respiratory: Negative for cough, hemoptysis, sputum production, shortness of breath and wheezing.   Cardiovascular: Negative for chest pain, palpitations, orthopnea and leg swelling.  Gastrointestinal: Negative for abdominal pain, constipation, diarrhea, nausea and vomiting.  Genitourinary: Negative for dysuria, frequency, hematuria and urgency.  Musculoskeletal: Negative for back pain, joint pain, myalgias and neck pain.  Skin: Negative for itching and rash.  Neurological: Negative for tingling, tremors, sensory change, speech change, focal weakness, seizures, weakness and headaches.  Psychiatric/Behavioral: Negative for memory loss, substance abuse and suicidal ideas. The patient is not nervous/anxious.       Objective:  Physical Exam   Vitals:   09/20/17 1431  BP: 116/76  Pulse: 73  SpO2: 93%  Weight: 242 lb (109.8 kg)  Height: _0  (1.702 m)   RA  Gen: well appearing HENT: OP clear, TM's clear, neck supple PULM: CTA B, normal percussion CV: RRR, no mgr, trace edema GI: BS+, soft, nontender Derm: no cyanosis or rash Psyche: normal mood and affect   CBC    Component Value Date/Time   WBC 6.1 06/13/2017 1109   RBC 4.83 06/13/2017 1109   HGB 14.6 06/13/2017 1109   HCT 43.1 06/13/2017 1109   PLT 285 06/13/2017 1109   MCV 89.2 06/13/2017  Penny Bauer 30.2 06/13/2017 1109   MCHC 33.9 06/13/2017 1109   RDW 12.4 06/13/2017 1109     Chest imaging:  PFT:  Labs:  Path:  Echo: May 2019 echocardiogram showed normal LV EF, normal RV size and function, no valvular abnormalities  Sleep study: July 2017 home sleep test AHI 14, O2 saturation nadir 74%, average O2 saturation 87% July 2019 Home sleep test AHI 12.3, O2 saturation nadir 80%, av 87%   Heart  Catheterization:  Home      Assessment & Plan:   No diagnosis found.  Discussion: [name] has mild sleep apnea with symptoms.  Today we talked about options for treatment including CPAP, weight loss, positional therapy, and an oral appliance.  Interestingly, I think if she could sleep on her left side she would see fewer episodes of apnea during the course of the evening.  Weight loss was once again encouraged.  She is interested in trying something other than CPAP.  Plan: Obstructive sleep apnea: We will refer you to Dr. Ron Parker in [place] for an oral appliance for obstructive sleep apnea  Nocturnal hypoxemia: Please call us once you have the oral appliance so that we can arrange for an overnight oximetry test while you are wearing that device  We will see you back in 6 to 8 weeks or sooner if needed    Current Outpatient Medications:  .  ACCU-CHEK FASTCLIX LANCETS MISC, U TO TEST BLOOD SUGAR DAILY, Disp: , Rfl: 0 .  ACCU-CHEK GUIDE test strip, U TO CHECK BLOOD SUGAR D, Disp: , Rfl: 0 .  AMBULATORY NON FORMULARY MEDICATION, Medication Name: CPAP and supplies set to auto-titrate 5-10cm water pressure with 2 liter oxygen. Dx: nocturnal hypoxemia and OSA. [employer]., Disp: 1 vial, Rfl: 0 .  aspirin 81 MG tablet, Take 81 mg by mouth daily.  , Disp: , Rfl:  .  atorvastatin (LIPITOR) 20 MG tablet, Take 1 tablet (20 mg total) by mouth daily., Disp: 90 tablet, Rfl: 3 .  blood glucose meter kit and supplies KIT, Dispense based on patient and insurance preference. Use up to once a day as directed. (FOR ICD-9 250.00, 250.01)., Disp: 1 each, Rfl: 0 .  hydrochlorothiazide (HYDRODIURIL) 25 MG tablet, Take 1 tablet (25 mg total) by mouth daily., Disp: 90 tablet, Rfl: 3 .  levothyroxine (SYNTHROID, LEVOTHROID) 150 MCG tablet, TAKE 1 TABLET EVERY DAY, Disp: 90 tablet, Rfl: 3 .  metFORMIN (GLUCOPHAGE-XR) 500 MG 24 hr tablet, Take 2 tablets (1,000 mg total) by mouth daily with breakfast., Disp: 180  tablet, Rfl: 3 .  metoprolol succinate (TOPROL XL) 100 MG 24 hr tablet, Take 1 tablet (100 mg total) by mouth daily. Take with or immediately following a meal., Disp: 90 tablet, Rfl: 1 .  Multiple Vitamin (MULTIVITAMIN) capsule, Take 1 capsule by mouth., Disp: , Rfl:  .  triamcinolone ointment (KENALOG) 0.5 %, Apply 1 application topically 2 (two) times daily., Disp: 45 g, Rfl: 0

## 2017-09-20 NOTE — Patient Instructions (Signed)
Obstructive sleep apnea: We will refer you to Dr. Ron Parker in [place] for an oral appliance for obstructive sleep apnea  Nocturnal hypoxemia: Please call us once you have the oral appliance so that we can arrange for an overnight oximetry test while you are wearing that device  We will see you back in 6 to 8 weeks or sooner if needed

## 2017-09-24 NOTE — Progress Notes (Signed)
HPI: FU palpitations. Patient had a cardiac evaluation in 2001 for palpitations.  She had a treadmill that showed ST changes and question short run of atrial fibrillation with aberrancy.  She underwent cardiac catheterization that revealed nonobstructive coronary disease and normal LV function.  She was treated with Toprol long-term.  She has noticed recently recurrent palpitations.    Echocardiogram May 2019 showed normal LV function, mild diastolic dysfunction, mild left atrial enlargement.  Cardiac event monitor July 2019 showed no significant arrhythmia. Since last seen, she denies dyspnea on exertion, orthopnea, PND, pedal edema or exertional chest pain.  An occasional ache in her chest in the evening.  Her palpitations improved with only a rare skip.  Current Outpatient Medications  Medication Sig Dispense Refill  . ACCU-CHEK FASTCLIX LANCETS MISC U TO TEST BLOOD SUGAR DAILY  0  . ACCU-CHEK GUIDE test strip U TO CHECK BLOOD SUGAR D  0  . AMBULATORY NON FORMULARY MEDICATION Medication Name: CPAP and supplies set to auto-titrate 5-10cm water pressure with 2 liter oxygen. Dx: nocturnal hypoxemia and OSA. [employer]. 1 vial 0  . aspirin 81 MG tablet Take 81 mg by mouth daily.      Marland Kitchen atorvastatin (LIPITOR) 20 MG tablet Take 1 tablet (20 mg total) by mouth daily. 90 tablet 3  . blood glucose meter kit and supplies KIT Dispense based on patient and insurance preference. Use up to once a day as directed. (FOR ICD-9 250.00, 250.01). 1 each 0  . hydrochlorothiazide (HYDRODIURIL) 25 MG tablet Take 1 tablet (25 mg total) by mouth daily. 90 tablet 3  . levothyroxine (SYNTHROID, LEVOTHROID) 150 MCG tablet TAKE 1 TABLET EVERY DAY 90 tablet 3  . metFORMIN (GLUCOPHAGE-XR) 500 MG 24 hr tablet Take 2 tablets (1,000 mg total) by mouth daily with breakfast. 180 tablet 3  . metoprolol succinate (TOPROL XL) 100 MG 24 hr tablet Take 1 tablet (100 mg total) by mouth daily. Take with or immediately following a  meal. 90 tablet 1  . triamcinolone ointment (KENALOG) 0.5 % Apply 1 application topically 2 (two) times daily. 45 g 0   No current facility-administered medications for this visit.      Past Medical History:  Diagnosis Date  . Adenocarcinoma ([place])   . Controlled type 2 diabetes mellitus without complication, without long-term current use of insulin ([place]) 03/07/2017  . DEFICIENCY, VITAMIN D NOS 01/18/2006   Qualifier: Diagnosis of  By: Madilyn Fireman MD, Barnetta Chapel    . Endometrial cancer ([place])   . Endometrial carcinoma ([place]) 09/06/2012   Endometrial biopsy performed on 08/27/2012. Final pathology revealed a patchy a grade 2 endometrial adenocarcinoma. She was referred to Dr. Genia Del at [facility].   Marland Kitchen HYPERCHOLESTEROLEMIA 01/18/2006   Qualifier: Diagnosis of  By: Madilyn Fireman MD, Barnetta Chapel    . Hyperlipidemia   . Hypertension   . Hypothyroid   . Hypothyroidism 09/05/2012  . Iatrogenic hypothyroidism 01/18/2006   Qualifier: Diagnosis of  By: Madilyn Fireman MD, Barnetta Chapel    . Lower leg edema 08/04/2010   On hctz for this(not for blood pressure).    . Palpitations 09/16/2009   Qualifier: Diagnosis of  By: Madilyn Fireman MD, Barnetta Chapel    . TACHYCARDIA, HX OF 01/18/2006   Qualifier: Diagnosis of  By: Madilyn Fireman MD, Barnetta Chapel    . Type II or unspecified type diabetes mellitus without mention of complication, not stated as uncontrolled 01/17/2013    Past Surgical History:  Procedure Laterality Date  . CARDIAC CATHETERIZATION  2001  30% blockage  . CHOLECYSTECTOMY  2003  . LAP-BAND surgery  April 2007  . ROBOTIC ASSISTED LAPAROSCOPIC HYSTERECTOMY AND SALPINGECTOMY  2014  . TUBAL LIGATION  1989    Social History   Socioeconomic History  . Marital status: Married    Spouse name: Merry Proud  . Number of children: 3  . Years of education: Not on file  . Highest education level: Not on file  Occupational History  . Occupation: Therapist, sports    Comment: [employer]   Social Needs  . Financial resource  strain: Not on file  . Food insecurity:    Worry: Not on file    Inability: Not on file  . Transportation needs:    Medical: Not on file    Non-medical: Not on file  Tobacco Use  . Smoking status: Never Smoker  . Smokeless tobacco: Never Used  Substance and Sexual Activity  . Alcohol use: Yes    Comment: Rarely  . Drug use: No  . Sexual activity: Not on file  Lifestyle  . Physical activity:    Days per week: Not on file    Minutes per session: Not on file  . Stress: Not on file  Relationships  . Social connections:    Talks on phone: Not on file    Gets together: Not on file    Attends religious service: Not on file    Active member of club or organization: Not on file    Attends meetings of clubs or organizations: Not on file    Relationship status: Not on file  . Intimate partner violence:    Fear of current or ex partner: Not on file    Emotionally abused: Not on file    Physically abused: Not on file    Forced sexual activity: Not on file  Other Topics Concern  . Not on file  Social History Narrative   Her daughers are Charma Igo and Wells Guiles Huminski. No regular exercise. Husband is disabled.    Family History  Problem Relation Age of Onset  . Pancreatic cancer Mother   . Depression Mother   . Throat cancer Brother   . Heart attack Father 37  . Hypertension Father   . Stroke Father   . Depression Daughter   . Diabetes Unknown        Grandfather  . Liver disease Sister        Fatty liver    ROS: no fevers or chills, productive cough, hemoptysis, dysphasia, odynophagia, melena, hematochezia, dysuria, hematuria, rash, seizure activity, orthopnea, PND, pedal edema, claudication. Remaining systems are negative.  Physical Exam: Well-developed well-nourished in no acute distress.  Skin is warm and dry.  HEENT is normal.  Neck is supple.  Chest is clear to auscultation with normal expansion.  Cardiovascular exam is regular rate and rhythm.  Abdominal exam  nontender or distended. No masses palpated. Extremities show no edema. neuro grossly intact    A/P  1 palpitations-event monitor with no significant arrhythmia.  Palpitations have improved.  Continue beta-blocker.  2 hypertension-blood pressure is controlled.  Continue present medications.  3 history of nonobstructive coronary disease-continue aspirin.  4 hyperlipidemia-continue statin.  5 question sleep apnea-seen by pulmonary and she has an appointment for possible mouthpiece to correct.  6 chest pain-occasional ache but no exertional chest pain.  No plans for further ischemia evaluation.  Kirk Ruths, MD

## 2017-09-26 ENCOUNTER — Ambulatory Visit (INDEPENDENT_AMBULATORY_CARE_PROVIDER_SITE_OTHER): Payer: Medicare HMO | Admitting: Cardiology

## 2017-09-26 ENCOUNTER — Encounter: Payer: Self-pay | Admitting: Cardiology

## 2017-09-26 VITALS — BP 125/75 | HR 91 | Ht 67.0 in | Wt 243.0 lb

## 2017-09-26 DIAGNOSIS — E7800 Pure hypercholesterolemia, unspecified: Secondary | ICD-10-CM | POA: Diagnosis not present

## 2017-09-26 DIAGNOSIS — I2510 Atherosclerotic heart disease of native coronary artery without angina pectoris: Secondary | ICD-10-CM | POA: Diagnosis not present

## 2017-09-26 DIAGNOSIS — I10 Essential (primary) hypertension: Secondary | ICD-10-CM | POA: Diagnosis not present

## 2017-09-26 DIAGNOSIS — R002 Palpitations: Secondary | ICD-10-CM | POA: Diagnosis not present

## 2017-09-26 NOTE — Patient Instructions (Signed)
Your physician wants you to follow-up in: ONE YEAR WITH DR CRENSHAW You will receive a reminder letter in the mail two months in advance. If you don't receive a letter, please call our office to schedule the follow-up appointment.   If you need a refill on your cardiac medications before your next appointment, please call your pharmacy.  

## 2017-10-17 DIAGNOSIS — E7800 Pure hypercholesterolemia, unspecified: Secondary | ICD-10-CM | POA: Diagnosis not present

## 2017-10-17 DIAGNOSIS — E032 Hypothyroidism due to medicaments and other exogenous substances: Secondary | ICD-10-CM | POA: Diagnosis not present

## 2017-10-17 DIAGNOSIS — E119 Type 2 diabetes mellitus without complications: Secondary | ICD-10-CM | POA: Diagnosis not present

## 2017-10-17 LAB — COMPLETE METABOLIC PANEL WITH GFR
AG Ratio: 1.4 (calc) (ref 1.0–2.5)
ALBUMIN MSPROF: 3.9 g/dL (ref 3.6–5.1)
ALKALINE PHOSPHATASE (APISO): 94 U/L (ref 33–130)
ALT: 23 U/L (ref 6–29)
AST: 16 U/L (ref 10–35)
BILIRUBIN TOTAL: 0.7 mg/dL (ref 0.2–1.2)
BUN: 17 mg/dL (ref 7–25)
CHLORIDE: 103 mmol/L (ref 98–110)
CO2: 28 mmol/L (ref 20–32)
CREATININE: 0.7 mg/dL (ref 0.50–0.99)
Calcium: 9.6 mg/dL (ref 8.6–10.4)
GFR, Est African American: 102 mL/min/{1.73_m2} (ref >=60)
GFR, Est Non African American: 88 mL/min/{1.73_m2} (ref >=60)
GLUCOSE: 122 mg/dL — AB (ref 65–99)
Globulin: 2.8 g/dL (calc) (ref 1.9–3.7)
Potassium: 3.7 mmol/L (ref 3.5–5.3)
Sodium: 141 mmol/L (ref 135–146)
Total Protein: 6.7 g/dL (ref 6.1–8.1)

## 2017-10-17 LAB — LIPID PANEL
CHOL/HDL RATIO: 4.5 (calc) (ref <5.0)
Cholesterol: 179 mg/dL (ref <200)
HDL: 40 mg/dL — AB (ref >50)
LDL CHOLESTEROL (CALC): 116 mg/dL — AB
Non-HDL Cholesterol (Calc): 139 mg/dL (calc) — ABNORMAL HIGH (ref <130)
Triglycerides: 124 mg/dL (ref <150)

## 2017-10-17 LAB — TSH: TSH: 0.46 mIU/L (ref 0.40–4.50)

## 2017-10-18 ENCOUNTER — Ambulatory Visit (INDEPENDENT_AMBULATORY_CARE_PROVIDER_SITE_OTHER): Payer: Medicare HMO | Admitting: Family Medicine

## 2017-10-18 DIAGNOSIS — Z23 Encounter for immunization: Secondary | ICD-10-CM

## 2017-10-18 NOTE — Progress Notes (Signed)
Pt here for flu shot. Afebrile,no recent illness. Vaccination given, pt tolerated well..Espiridion Supinski Lynetta, CMA  

## 2017-10-19 ENCOUNTER — Other Ambulatory Visit: Payer: Self-pay | Admitting: *Deleted

## 2017-10-19 DIAGNOSIS — E032 Hypothyroidism due to medicaments and other exogenous substances: Secondary | ICD-10-CM

## 2017-11-15 DIAGNOSIS — C541 Malignant neoplasm of endometrium: Secondary | ICD-10-CM | POA: Diagnosis not present

## 2017-12-24 ENCOUNTER — Other Ambulatory Visit: Payer: Self-pay | Admitting: Family Medicine

## 2017-12-24 DIAGNOSIS — E032 Hypothyroidism due to medicaments and other exogenous substances: Secondary | ICD-10-CM

## 2018-01-08 ENCOUNTER — Ambulatory Visit: Payer: Medicare HMO | Admitting: Family Medicine

## 2018-01-16 DIAGNOSIS — H2513 Age-related nuclear cataract, bilateral: Secondary | ICD-10-CM | POA: Diagnosis not present

## 2018-01-16 DIAGNOSIS — H527 Unspecified disorder of refraction: Secondary | ICD-10-CM | POA: Diagnosis not present

## 2018-02-05 ENCOUNTER — Encounter: Payer: Self-pay | Admitting: Family Medicine

## 2018-02-05 ENCOUNTER — Ambulatory Visit (INDEPENDENT_AMBULATORY_CARE_PROVIDER_SITE_OTHER): Payer: Medicare HMO | Admitting: Family Medicine

## 2018-02-05 VITALS — BP 127/62 | HR 77 | Ht 67.0 in | Wt 244.0 lb

## 2018-02-05 DIAGNOSIS — G4733 Obstructive sleep apnea (adult) (pediatric): Secondary | ICD-10-CM

## 2018-02-05 DIAGNOSIS — E7800 Pure hypercholesterolemia, unspecified: Secondary | ICD-10-CM | POA: Diagnosis not present

## 2018-02-05 DIAGNOSIS — E119 Type 2 diabetes mellitus without complications: Secondary | ICD-10-CM

## 2018-02-05 DIAGNOSIS — E032 Hypothyroidism due to medicaments and other exogenous substances: Secondary | ICD-10-CM | POA: Diagnosis not present

## 2018-02-05 LAB — POCT GLYCOSYLATED HEMOGLOBIN (HGB A1C): HEMOGLOBIN A1C: 5.9 % — AB (ref 4.0–5.6)

## 2018-02-05 MED ORDER — ATORVASTATIN CALCIUM 20 MG PO TABS: 30.0000 mg | ORAL_TABLET | Freq: Every day | ORAL | 3 refills | Status: DC

## 2018-02-05 NOTE — Progress Notes (Signed)
Subjective:    CC: DM f/u 4 mo.   HPI:  Diabetes - no hypoglycemic events. No wounds or sores that are not healing well. No increased thirst or urination. Checking glucose at home. Taking medications as prescribed without any side effects.  F/U OSA -she follows with Dr. Lake Bells her pulmonologist.  They discussed her getting an oral appliance or Dr. Ron Parker in [place]. She has been trying to wear is it consistantly but has been uncomfortable and difficult.  She has noticed that her snoring has resolved and she is felt less sleepy during the daytime.  Hyperlipidemia - she increased her atorvastatin to 60mg .  She increased her dose in SE[place]. Doing well on new dose.   Lab Results  Component Value Date   CHOL 179 10/17/2017   HDL 40 (L) 10/17/2017   LDLCALC 116 (H) 10/17/2017   TRIG 124 10/17/2017   CHOLHDL 4.5 10/17/2017   Hypothyroidism - Taking medication regularly in the AM away from food and vitamins, etc. No recent change to skin, hair, or energy levels.  She has been taking half a tab on Sundays and says she has missed a couple of doses in between.  We adjusted her dose in September since her TSH was a little too suppressed.   Past medical history, Surgical history, Family history not pertinant except as noted below, Social history, Allergies, and medications have been entered into the medical record, reviewed, and corrections made.   Review of Systems: No fevers, chills, night sweats, weight loss, chest pain, or shortness of breath.   Objective:    General: Well Developed, well nourished, and in no acute distress.  Neuro: Alert and oriented x3, extra-ocular muscles intact, sensation grossly intact.  HEENT: Normocephalic, atraumatic. No thyromegaly.  Skin: Warm and dry, no rashes. Cardiac: Regular rate and rhythm, no murmurs rubs or gallops, no lower extremity edema.  Respiratory: Clear to auscultation bilaterally. Not using accessory muscles, speaking in full  sentences.   Impression and Recommendations:    DM -well controlled   Continue current regimen.  Follow-up in 4 months. Lab Results  Component Value Date   HGBA1C 5.9 09/06/2017    OSA -trying oral appliance for now but may consider switching to CPAP later on.  Hyperlipidemia-due for refills on atorvastatin.  A new prescription for the 30 mg dose.  We will go ahead and recheck lipids and liver enzymes and she did increase her dose.  Hypothyroid - will recheck TSH on new dose.  She has been tolerating it well without any problems.

## 2018-02-12 DIAGNOSIS — Z1231 Encounter for screening mammogram for malignant neoplasm of breast: Secondary | ICD-10-CM | POA: Diagnosis not present

## 2018-02-12 LAB — HM MAMMOGRAPHY

## 2018-02-21 ENCOUNTER — Encounter: Payer: Self-pay | Admitting: Family Medicine

## 2018-03-23 ENCOUNTER — Other Ambulatory Visit: Payer: Self-pay | Admitting: Family Medicine

## 2018-03-23 DIAGNOSIS — E032 Hypothyroidism due to medicaments and other exogenous substances: Secondary | ICD-10-CM

## 2018-04-14 ENCOUNTER — Other Ambulatory Visit: Payer: Self-pay | Admitting: Family Medicine

## 2018-06-04 ENCOUNTER — Encounter: Payer: Self-pay | Admitting: Family Medicine

## 2018-06-04 ENCOUNTER — Ambulatory Visit (INDEPENDENT_AMBULATORY_CARE_PROVIDER_SITE_OTHER): Payer: Medicare HMO | Admitting: Family Medicine

## 2018-06-04 VITALS — BP 114/69 | HR 68 | Ht 67.0 in | Wt 235.0 lb

## 2018-06-04 DIAGNOSIS — G4733 Obstructive sleep apnea (adult) (pediatric): Secondary | ICD-10-CM

## 2018-06-04 DIAGNOSIS — E032 Hypothyroidism due to medicaments and other exogenous substances: Secondary | ICD-10-CM | POA: Diagnosis not present

## 2018-06-04 DIAGNOSIS — N3281 Overactive bladder: Secondary | ICD-10-CM

## 2018-06-04 DIAGNOSIS — E119 Type 2 diabetes mellitus without complications: Secondary | ICD-10-CM

## 2018-06-04 MED ORDER — OXYBUTYNIN CHLORIDE 5 MG PO TABS: 5.0000 mg | ORAL_TABLET | Freq: Two times a day (BID) | ORAL | 1 refills | Status: DC

## 2018-06-04 NOTE — Progress Notes (Signed)
Virtual Visit via Telephone Note  I connected with [name] on 06/04/18 at  2:00 PM EDT by telephone and verified that I am speaking with the correct person using two identifiers.   I discussed the limitations, risks, security and privacy concerns of performing an evaluation and management service by telephone and the availability of in person appointments. I also discussed with the patient that there may be a patient responsible charge related to this service. The patient expressed understanding and agreed to proceed.   Subjective:    CC: 4 mo f/u DM  HPI: Diabetes - no hypoglycemic events. No wounds or sores that are not healing well. No increased thirst or urination. Checking glucose at home. Taking medications as prescribed without any side effects.sugars under 120 last 3 times.   Hypothyroidism - Taking medication regularly in the AM away from food and vitamins, etc. No recent change to skin, hair, or energy levels.  OSA - she opted for oral appliance but hasn't been wearing itregularly.   She would like to start something for OAB. Oxybutin 5 mg is on her medication list. She was on Detrol years ago and felt it helped but it got expensive. She feels the urgency has gotten worse as she has gotten older.    Has been helping her sick daughter who has been at home and has lost some weight.  She has lost about  10 lbs.  She hasn't been snacking s much.    Past medical history, Surgical history, Family history not pertinant except as noted below, Social history, Allergies, and medications have been entered into the medical record, reviewed, and corrections made.   Review of Systems: No fevers, chills, night sweats, weight loss, chest pain, or shortness of breath.   Objective:    General: Speaking clearly in complete sentences without any shortness of breath.  Alert and oriented x3.  Normal judgment. No apparent acute distress.    Impression and Recommendations:    DM - she has  lost some weight and is doing well. Will check A1C on her labs.    Hypothyroid - feels thyroid is well regulated. Due to recheck TSH.  Lab Results  Component Value Date   TSH 0.46 10/17/2017     OSA - f/u end of summer. She wants to try the oral applicanc a little longer.    OAB - will try ditropan 5mg  BID. The generic is covered under her insurance.        I discussed the assessment and treatment plan with the patient. The patient was provided an opportunity to ask questions and all were answered. The patient agreed with the plan and demonstrated an understanding of the instructions.   The patient was advised to call back or seek an in-person evaluation if the symptoms worsen or if the condition fails to improve as anticipated.  I provided 22 minutes of non-face-to-face time during this encounter.   [name], MD

## 2018-06-04 NOTE — Progress Notes (Signed)
Past few BS: 118, 109, 115.   She would like to start something for OAB. Oxybutin 5 mg is on her medication list she was informed that this is what is covered.Penny Bauer, Lahoma Crocker, CMA

## 2018-06-12 ENCOUNTER — Encounter: Payer: Self-pay | Admitting: Family Medicine

## 2018-07-12 ENCOUNTER — Ambulatory Visit: Payer: Medicare HMO | Admitting: Osteopathic Medicine

## 2018-07-17 DIAGNOSIS — E119 Type 2 diabetes mellitus without complications: Secondary | ICD-10-CM | POA: Diagnosis not present

## 2018-07-17 DIAGNOSIS — E032 Hypothyroidism due to medicaments and other exogenous substances: Secondary | ICD-10-CM | POA: Diagnosis not present

## 2018-07-18 LAB — COMPLETE METABOLIC PANEL WITH GFR
AG Ratio: 1.4 (calc) (ref 1.0–2.5)
ALT: 15 U/L (ref 6–29)
AST: 13 U/L (ref 10–35)
Albumin: 4 g/dL (ref 3.6–5.1)
Alkaline phosphatase (APISO): 79 U/L (ref 37–153)
BUN: 16 mg/dL (ref 7–25)
CO2: 30 mmol/L (ref 20–32)
Calcium: 9.5 mg/dL (ref 8.6–10.4)
Chloride: 105 mmol/L (ref 98–110)
Creat: 0.72 mg/dL (ref 0.60–0.93)
GFR, Est African American: 98 mL/min/{1.73_m2} (ref >=60)
GFR, Est Non African American: 85 mL/min/{1.73_m2} (ref >=60)
Globulin: 2.8 g/dL (calc) (ref 1.9–3.7)
Glucose, Bld: 113 mg/dL — ABNORMAL HIGH (ref 65–99)
Potassium: 4.2 mmol/L (ref 3.5–5.3)
Sodium: 142 mmol/L (ref 135–146)
Total Bilirubin: 0.6 mg/dL (ref 0.2–1.2)
Total Protein: 6.8 g/dL (ref 6.1–8.1)

## 2018-07-18 LAB — LIPID PANEL
Cholesterol: 129 mg/dL (ref <200)
HDL: 42 mg/dL — ABNORMAL LOW (ref >=50)
LDL Cholesterol (Calc): 70 mg/dL (calc)
Non-HDL Cholesterol (Calc): 87 mg/dL (calc) (ref <130)
Total CHOL/HDL Ratio: 3.1 (calc) (ref <5.0)
Triglycerides: 83 mg/dL (ref <150)

## 2018-07-18 LAB — CBC
HCT: 40.3 % (ref 35.0–45.0)
Hemoglobin: 13.2 g/dL (ref 11.7–15.5)
MCH: 30.2 pg (ref 27.0–33.0)
MCHC: 32.8 g/dL (ref 32.0–36.0)
MCV: 92.2 fL (ref 80.0–100.0)
MPV: 11.7 fL (ref 7.5–12.5)
Platelets: 251 10*3/uL (ref 140–400)
RBC: 4.37 10*6/uL (ref 3.80–5.10)
RDW: 12.6 % (ref 11.0–15.0)
WBC: 6.1 10*3/uL (ref 3.8–10.8)

## 2018-07-18 LAB — HEMOGLOBIN A1C
Hgb A1c MFr Bld: 5.8 % of total Hgb — ABNORMAL HIGH (ref <5.7)
Mean Plasma Glucose: 120 (calc)
eAG (mmol/L): 6.6 (calc)

## 2018-07-18 LAB — TSH: TSH: 1.36 mIU/L (ref 0.40–4.50)

## 2018-10-05 ENCOUNTER — Other Ambulatory Visit: Payer: Self-pay | Admitting: Family Medicine

## 2018-10-08 ENCOUNTER — Telehealth: Payer: Self-pay | Admitting: Family Medicine

## 2018-10-08 NOTE — Telephone Encounter (Signed)
I got a refill request for this patient. She is past due for a follow up with PCP. Can we call her to schedule? Thanks

## 2018-10-08 NOTE — Telephone Encounter (Signed)
Attempted to reach patient multiple times and the phone just rang. Then there was a busy dial. Could not leave a voicemail.

## 2018-10-14 ENCOUNTER — Ambulatory Visit (INDEPENDENT_AMBULATORY_CARE_PROVIDER_SITE_OTHER): Payer: Medicare HMO | Admitting: Family Medicine

## 2018-10-14 DIAGNOSIS — Z23 Encounter for immunization: Secondary | ICD-10-CM

## 2018-10-14 NOTE — Progress Notes (Signed)
Patient presented to clinic with spouse and requested a high dose flu vaccine. Patient declined vitals. She had not had any reactions in the past. No immediate complications during the visit.

## 2018-11-01 ENCOUNTER — Other Ambulatory Visit: Payer: Self-pay

## 2018-11-01 DIAGNOSIS — Z20822 Contact with and (suspected) exposure to covid-19: Secondary | ICD-10-CM

## 2018-11-02 LAB — NOVEL CORONAVIRUS, NAA: SARS-CoV-2, NAA: NOT DETECTED

## 2018-11-14 DIAGNOSIS — C541 Malignant neoplasm of endometrium: Secondary | ICD-10-CM | POA: Diagnosis not present

## 2018-12-18 ENCOUNTER — Other Ambulatory Visit: Payer: Self-pay | Admitting: Family Medicine

## 2018-12-27 ENCOUNTER — Other Ambulatory Visit: Payer: Self-pay | Admitting: Family Medicine

## 2019-01-02 ENCOUNTER — Other Ambulatory Visit: Payer: Self-pay | Admitting: Family Medicine

## 2019-01-02 DIAGNOSIS — E032 Hypothyroidism due to medicaments and other exogenous substances: Secondary | ICD-10-CM

## 2019-02-24 ENCOUNTER — Other Ambulatory Visit: Payer: Self-pay | Admitting: Family Medicine

## 2019-02-26 ENCOUNTER — Ambulatory Visit: Payer: Medicare HMO

## 2019-03-06 ENCOUNTER — Ambulatory Visit: Payer: Medicare HMO | Attending: Internal Medicine

## 2019-03-06 DIAGNOSIS — Z23 Encounter for immunization: Secondary | ICD-10-CM | POA: Insufficient documentation

## 2019-03-06 NOTE — Progress Notes (Signed)
   Covid-19 Vaccination Clinic  Name:  Penny Bauer    MRN: [id number] DOB: Aug 15, 1950  03/06/2019  Ms. Penny Bauer was observed post Covid-19 immunization for 15 minutes without incidence. She was provided with Vaccine Information Sheet and instruction to access the V-Safe system.   Ms. Penny Bauer was instructed to call 911 with any severe reactions post vaccine: Marland Kitchen Difficulty breathing  . Swelling of your face and throat  . A fast heartbeat  . A bad rash all over your body  . Dizziness and weakness    Immunizations Administered    Name Date Dose VIS Date Route   Pfizer COVID-19 Vaccine 03/06/2019  8:54 AM 0.3 mL 01/10/2019 Intramuscular   Manufacturer: [place]   Lot: [id number]   [place]: [id number]

## 2019-03-09 ENCOUNTER — Ambulatory Visit: Payer: Medicare HMO

## 2019-03-31 ENCOUNTER — Ambulatory Visit: Payer: Medicare HMO | Attending: Internal Medicine

## 2019-03-31 DIAGNOSIS — Z23 Encounter for immunization: Secondary | ICD-10-CM

## 2019-03-31 NOTE — Progress Notes (Signed)
   Covid-19 Vaccination Clinic  Name:  Penny Bauer    MRN: [id number] DOB: 05-26-1950  03/31/2019  [name] was observed post Covid-19 immunization for 15 minutes without incidence. She was provided with Vaccine Information Sheet and instruction to access the V-Safe system.   [name] was instructed to call 911 with any severe reactions post vaccine: Marland Kitchen Difficulty breathing  . Swelling of your face and throat  . A fast heartbeat  . A bad rash all over your body  . Dizziness and weakness    Immunizations Administered    Name Date Dose VIS Date Route   Pfizer COVID-19 Vaccine 03/31/2019  1:30 PM 0.3 mL 01/10/2019 Intramuscular   Manufacturer: [place]   Lot: [id number]   [place]: [id number]

## 2019-04-03 DIAGNOSIS — H524 Presbyopia: Secondary | ICD-10-CM | POA: Diagnosis not present

## 2019-04-03 DIAGNOSIS — H2513 Age-related nuclear cataract, bilateral: Secondary | ICD-10-CM | POA: Diagnosis not present

## 2019-04-14 ENCOUNTER — Other Ambulatory Visit: Payer: Self-pay | Admitting: Family Medicine

## 2019-05-15 ENCOUNTER — Encounter: Payer: Self-pay | Admitting: Family Medicine

## 2019-05-15 ENCOUNTER — Ambulatory Visit (INDEPENDENT_AMBULATORY_CARE_PROVIDER_SITE_OTHER): Payer: Medicare HMO | Admitting: Family Medicine

## 2019-05-15 ENCOUNTER — Other Ambulatory Visit: Payer: Self-pay

## 2019-05-15 VITALS — BP 126/65 | HR 70 | Ht 67.0 in | Wt 245.0 lb

## 2019-05-15 DIAGNOSIS — E119 Type 2 diabetes mellitus without complications: Secondary | ICD-10-CM | POA: Diagnosis not present

## 2019-05-15 DIAGNOSIS — R002 Palpitations: Secondary | ICD-10-CM

## 2019-05-15 DIAGNOSIS — E032 Hypothyroidism due to medicaments and other exogenous substances: Secondary | ICD-10-CM

## 2019-05-15 DIAGNOSIS — G4733 Obstructive sleep apnea (adult) (pediatric): Secondary | ICD-10-CM | POA: Diagnosis not present

## 2019-05-15 LAB — POCT GLYCOSYLATED HEMOGLOBIN (HGB A1C): Hemoglobin A1C: 6 % — AB (ref 4.0–5.6)

## 2019-05-15 MED ORDER — LEVOTHYROXINE SODIUM 150 MCG PO TABS: 150.0000 ug | ORAL_TABLET | Freq: Every day | ORAL | 1 refills | Status: DC

## 2019-05-15 MED ORDER — ATORVASTATIN CALCIUM 20 MG PO TABS: ORAL_TABLET | ORAL | 3 refills | Status: DC

## 2019-05-15 MED ORDER — HYDROCHLOROTHIAZIDE 25 MG PO TABS: 25.0000 mg | ORAL_TABLET | Freq: Every day | ORAL | 3 refills | Status: DC

## 2019-05-15 MED ORDER — METFORMIN HCL ER 500 MG PO TB24: 1000.0000 mg | ORAL_TABLET | Freq: Every day | ORAL | 3 refills | Status: DC

## 2019-05-15 NOTE — Assessment & Plan Note (Signed)
Due to recheck TSH. 

## 2019-05-15 NOTE — Assessment & Plan Note (Signed)
We will let me know if symptoms recur.

## 2019-05-15 NOTE — Assessment & Plan Note (Signed)
A1C loooks great. F/U in 6 months. Due for BMP.

## 2019-05-15 NOTE — Assessment & Plan Note (Signed)
Unable to tolerate oral appliance.  Will consider CPAP was with who was noncommittal today.

## 2019-05-15 NOTE — Progress Notes (Signed)
Established Patient Office Visit  Subjective:  Patient ID: Penny Bauer, female    DOB: April 05, 1950  Age: 69 y.o. MRN: [id number]  CC:  Chief Complaint  Patient presents with  . Diabetes    HPI [name] presents for   Diabetes - no hypoglycemic events. No wounds or sores that are not healing well. No increased thirst or urination. Checking glucose at home. Taking medications as prescribed without any side effects.  F/U OSA -she did try the oral appliance but it did not fit well and it was too expensive.  She is not sure she wants to try CPAP but will think about it.  She does report occasional episodes of heaviness in her chest and her heart beating fast usually occurs at night after she gets in bed.  She says lately it actually has not occurred.  She did see cardiology a couple years ago for similar symptoms for palpitations and had a negative work-up at that time.    Past Medical History:  Diagnosis Date  . Adenocarcinoma ([place])   . Controlled type 2 diabetes mellitus without complication, without long-term current use of insulin ([place]) 03/07/2017  . DEFICIENCY, VITAMIN D NOS 01/18/2006   Qualifier: Diagnosis of  By: Madilyn Fireman MD, Barnetta Chapel    . Endometrial cancer ([place])   . Endometrial carcinoma ([place]) 09/06/2012   Endometrial biopsy performed on 08/27/2012. Final pathology revealed a patchy a grade 2 endometrial adenocarcinoma. She was referred to Dr. Genia Del at [facility].   Marland Kitchen HYPERCHOLESTEROLEMIA 01/18/2006   Qualifier: Diagnosis of  By: Madilyn Fireman MD, Barnetta Chapel    . Hyperlipidemia   . Hypertension   . Hypothyroid   . Hypothyroidism 09/05/2012  . Iatrogenic hypothyroidism 01/18/2006   Qualifier: Diagnosis of  By: Madilyn Fireman MD, Barnetta Chapel    . Lower leg edema 08/04/2010   On hctz for this(not for blood pressure).    . Palpitations 09/16/2009   Qualifier: Diagnosis of  By: Madilyn Fireman MD, Barnetta Chapel    . TACHYCARDIA, HX OF 01/18/2006   Qualifier: Diagnosis of  By:  Madilyn Fireman MD, Barnetta Chapel    . Type II or unspecified type diabetes mellitus without mention of complication, not stated as uncontrolled 01/17/2013    Past Surgical History:  Procedure Laterality Date  . CARDIAC CATHETERIZATION  2001   30% blockage  . CHOLECYSTECTOMY  2003  . LAP-BAND surgery  April 2007  . ROBOTIC ASSISTED LAPAROSCOPIC HYSTERECTOMY AND SALPINGECTOMY  2014  . TUBAL LIGATION  1989    Family History  Problem Relation Age of Onset  . Pancreatic cancer Mother   . Depression Mother   . Throat cancer Brother   . Heart attack Father 7  . Hypertension Father   . Stroke Father   . Depression Daughter   . Diabetes Unknown        Grandfather  . Liver disease Sister        Fatty liver    Social History   Socioeconomic History  . Marital status: Married    Spouse name: Merry Proud  . Number of children: 3  . Years of education: Not on file  . Highest education level: Not on file  Occupational History  . Occupation: Therapist, sports    CommentTeacher, music   Tobacco Use  . Smoking status: Never Smoker  . Smokeless tobacco: Never Used  Substance and Sexual Activity  . Alcohol use: Yes    Comment: Rarely  . Drug use: No  . Sexual activity: Not on  file  Other Topics Concern  . Not on file  Social History Narrative   Her daughers are Charma Igo and Wells Guiles Huminski. No regular exercise. Husband is disabled.   Social Determinants of Health   Financial Resource Strain:   . Difficulty of Paying Living Expenses:   Food Insecurity:   . Worried About Charity fundraiser in the Last Year:   . Arboriculturist in the Last Year:   Transportation Needs:   . Film/video editor (Medical):   Marland Kitchen Lack of Transportation (Non-Medical):   Physical Activity:   . Days of Exercise per Week:   . Minutes of Exercise per Session:   Stress:   . Feeling of Stress :   Social Connections:   . Frequency of Communication with Friends and Family:   . Frequency of Social Gatherings with  Friends and Family:   . Attends Religious Services:   . Active Member of Clubs or Organizations:   . Attends Archivist Meetings:   Marland Kitchen Marital Status:   Intimate Partner Violence:   . Fear of Current or Ex-Partner:   . Emotionally Abused:   Marland Kitchen Physically Abused:   . Sexually Abused:     Outpatient Medications Prior to Visit  Medication Sig Dispense Refill  . ACCU-CHEK FASTCLIX LANCETS MISC U TO TEST BLOOD SUGAR DAILY  0  . ACCU-CHEK GUIDE test strip U TO CHECK BLOOD SUGAR D  0  . aspirin 81 MG tablet Take 81 mg by mouth daily.      . blood glucose meter kit and supplies KIT Dispense based on patient and insurance preference. Use up to once a day as directed. (FOR ICD-9 250.00, 250.01). 1 each 0  . metoprolol succinate (TOPROL-XL) 100 MG 24 hr tablet TAKE 1 TABLET DAILY (TAKE WITH OR IMMEDIATELY FOLLOWING A MEAL) 90 tablet 1  . oxybutynin (DITROPAN) 5 MG tablet TAKE 1 TABLET TWICE DAILY 180 tablet 0  . atorvastatin (LIPITOR) 20 MG tablet TAKE 1 AND 1/2 TABLETS EVERY DAY 45 tablet 1  . hydrochlorothiazide (HYDRODIURIL) 25 MG tablet Take 1 tablet (25 mg total) by mouth daily. MUST MAKE APPOINTMENT 30 tablet 0  . levothyroxine (SYNTHROID) 150 MCG tablet TAKE 1 TABLET EVERY DAY MUST MAKE APPOINTMENT 30 tablet 0  . metFORMIN (GLUCOPHAGE-XR) 500 MG 24 hr tablet TAKE 2 TABLETS EVERY DAY WITH BREAKFAST 180 tablet 3   No facility-administered medications prior to visit.    Allergies  Allergen Reactions  . Zocor [Simvastatin] Other (See Comments)    Leg cramping    ROS Review of Systems    Objective:    Physical Exam  Constitutional: She is oriented to person, place, and time. She appears well-developed and well-nourished.  HENT:  Head: Normocephalic and atraumatic.  Cardiovascular: Normal rate, regular rhythm and normal heart sounds.  Pulmonary/Chest: Effort normal and breath sounds normal.  Neurological: She is alert and oriented to person, place, and time.  Skin: Skin  is warm and dry.  Psychiatric: She has a normal mood and affect. Her behavior is normal.    BP 126/65   Pulse 70   Ht '5\' 7"'$  (1.702 m)   Wt 245 lb (111.1 kg)   SpO2 96%   BMI 38.37 kg/m  Wt Readings from Last 3 Encounters:  05/15/19 245 lb (111.1 kg)  06/04/18 235 lb (106.6 kg)  02/05/18 244 lb (110.7 kg)     Health Maintenance Due  Topic Date Due  . DEXA SCAN  Never done  . OPHTHALMOLOGY EXAM  01/10/2018  . FOOT EXAM  06/06/2018  . URINE MICROALBUMIN  09/07/2018    There are no preventive care reminders to display for this patient.  Lab Results  Component Value Date   TSH 1.36 07/17/2018   Lab Results  Component Value Date   WBC 6.1 07/17/2018   HGB 13.2 07/17/2018   HCT 40.3 07/17/2018   MCV 92.2 07/17/2018   PLT 251 07/17/2018   Lab Results  Component Value Date   NA 142 07/17/2018   K 4.2 07/17/2018   CO2 30 07/17/2018   GLUCOSE 113 (H) 07/17/2018   BUN 16 07/17/2018   CREATININE 0.72 07/17/2018   BILITOT 0.6 07/17/2018   ALKPHOS 84 02/08/2016   AST 13 07/17/2018   ALT 15 07/17/2018   PROT 6.8 07/17/2018   ALBUMIN 3.7 02/08/2016   CALCIUM 9.5 07/17/2018   Lab Results  Component Value Date   CHOL 129 07/17/2018   Lab Results  Component Value Date   HDL 42 (L) 07/17/2018   Lab Results  Component Value Date   LDLCALC 70 07/17/2018   Lab Results  Component Value Date   TRIG 83 07/17/2018   Lab Results  Component Value Date   CHOLHDL 3.1 07/17/2018   Lab Results  Component Value Date   HGBA1C 6.0 (A) 05/15/2019      Assessment & Plan:   Problem List Items Addressed This Visit      Respiratory   OSA (obstructive sleep apnea)    Unable to tolerate oral appliance.  Will consider CPAP was with who was noncommittal today.        Endocrine   Iatrogenic hypothyroidism    Due to recheck TSH.        Relevant Medications   levothyroxine (SYNTHROID) 150 MCG tablet   Other Relevant Orders   TSH   Controlled type 2 diabetes  mellitus without complication, without long-term current use of insulin ([place]) - Primary    A1C loooks great. F/U in 6 months. Due for BMP.       Relevant Medications   atorvastatin (LIPITOR) 20 MG tablet   metFORMIN (GLUCOPHAGE-XR) 500 MG 24 hr tablet   Other Relevant Orders   POCT glycosylated hemoglobin (Hb A1C) (Completed)   BASIC METABOLIC PANEL WITH GFR     Other   PALPITATIONS    We will let me know if symptoms recur.         Meds ordered this encounter  Medications  . atorvastatin (LIPITOR) 20 MG tablet    Sig: TAKE 1 AND 1/2 TABLETS EVERY DAY(30 mg total)    Dispense:  135 tablet    Refill:  3  . hydrochlorothiazide (HYDRODIURIL) 25 MG tablet    Sig: Take 1 tablet (25 mg total) by mouth daily.    Dispense:  90 tablet    Refill:  3  . levothyroxine (SYNTHROID) 150 MCG tablet    Sig: Take 1 tablet (150 mcg total) by mouth daily before breakfast.    Dispense:  90 tablet    Refill:  1  . metFORMIN (GLUCOPHAGE-XR) 500 MG 24 hr tablet    Sig: Take 2 tablets (1,000 mg total) by mouth daily with breakfast.    Dispense:  180 tablet    Refill:  3    Follow-up: Return in about 6 months (around 11/14/2019) for Diabetes follow-up, Hypertension.    [name], MD

## 2019-05-16 LAB — BASIC METABOLIC PANEL WITH GFR
BUN: 13 mg/dL (ref 7–25)
CO2: 30 mmol/L (ref 20–32)
Calcium: 10 mg/dL (ref 8.6–10.4)
Chloride: 102 mmol/L (ref 98–110)
Creat: 0.71 mg/dL (ref 0.60–0.93)
GFR, Est African American: 99 mL/min/{1.73_m2} (ref >=60)
GFR, Est Non African American: 86 mL/min/{1.73_m2} (ref >=60)
Glucose, Bld: 106 mg/dL — ABNORMAL HIGH (ref 65–99)
Potassium: 4.6 mmol/L (ref 3.5–5.3)
Sodium: 141 mmol/L (ref 135–146)

## 2019-05-16 LAB — TSH: TSH: 0.44 mIU/L (ref 0.40–4.50)

## 2019-05-16 NOTE — Progress Notes (Signed)
All labs are normal. 

## 2019-06-17 DIAGNOSIS — Z1231 Encounter for screening mammogram for malignant neoplasm of breast: Secondary | ICD-10-CM | POA: Diagnosis not present

## 2019-06-17 LAB — HM MAMMOGRAPHY

## 2019-06-24 ENCOUNTER — Encounter: Payer: Self-pay | Admitting: Family Medicine

## 2019-07-21 ENCOUNTER — Other Ambulatory Visit: Payer: Self-pay

## 2019-07-21 DIAGNOSIS — E119 Type 2 diabetes mellitus without complications: Secondary | ICD-10-CM

## 2019-07-21 MED ORDER — ACCU-CHEK GUIDE VI STRP: ORAL_STRIP | 99 refills | Status: DC

## 2019-08-07 ENCOUNTER — Other Ambulatory Visit: Payer: Self-pay | Admitting: Family Medicine

## 2019-08-18 DIAGNOSIS — Z20822 Contact with and (suspected) exposure to covid-19: Secondary | ICD-10-CM | POA: Diagnosis not present

## 2019-08-18 DIAGNOSIS — Z03818 Encounter for observation for suspected exposure to other biological agents ruled out: Secondary | ICD-10-CM | POA: Diagnosis not present

## 2019-08-25 DIAGNOSIS — H524 Presbyopia: Secondary | ICD-10-CM | POA: Diagnosis not present

## 2019-08-25 DIAGNOSIS — H52223 Regular astigmatism, bilateral: Secondary | ICD-10-CM | POA: Diagnosis not present

## 2019-10-15 ENCOUNTER — Telehealth: Payer: Self-pay | Admitting: Family Medicine

## 2019-10-15 NOTE — Telephone Encounter (Signed)
Please call patient and just remind her that she and her husband Merry Proud are both due for diabetic eye exams and just encouraged her to get that scheduled this fall if at all possible.

## 2019-10-21 NOTE — Telephone Encounter (Signed)
Called pt and advised.  Pt states that she believes she had an eye exam in the spring.  Requested pt to have optometry visit notes sent to our office for HM.  Pt expressed understanding and is agreeable.  Charyl Bigger, CMA

## 2019-11-03 ENCOUNTER — Other Ambulatory Visit: Payer: Self-pay

## 2019-11-03 ENCOUNTER — Ambulatory Visit (INDEPENDENT_AMBULATORY_CARE_PROVIDER_SITE_OTHER): Payer: Medicare HMO | Admitting: Family Medicine

## 2019-11-03 DIAGNOSIS — Z23 Encounter for immunization: Secondary | ICD-10-CM | POA: Diagnosis not present

## 2019-11-03 NOTE — Progress Notes (Signed)
Pt here for flu shot. Afebrile,no recent illness. Vaccination given, pt tolerated well.  

## 2019-11-14 ENCOUNTER — Ambulatory Visit: Payer: Medicare HMO | Admitting: Family Medicine

## 2019-11-18 ENCOUNTER — Encounter: Payer: Self-pay | Admitting: Family Medicine

## 2019-11-18 ENCOUNTER — Ambulatory Visit (INDEPENDENT_AMBULATORY_CARE_PROVIDER_SITE_OTHER): Payer: Medicare HMO | Admitting: Family Medicine

## 2019-11-18 ENCOUNTER — Other Ambulatory Visit: Payer: Self-pay

## 2019-11-18 VITALS — BP 123/55 | HR 58 | Ht 67.0 in | Wt 237.0 lb

## 2019-11-18 DIAGNOSIS — E119 Type 2 diabetes mellitus without complications: Secondary | ICD-10-CM

## 2019-11-18 DIAGNOSIS — F439 Reaction to severe stress, unspecified: Secondary | ICD-10-CM | POA: Insufficient documentation

## 2019-11-18 DIAGNOSIS — R8290 Unspecified abnormal findings in urine: Secondary | ICD-10-CM | POA: Diagnosis not present

## 2019-11-18 LAB — POCT URINALYSIS DIP (CLINITEK)
Bilirubin, UA: NEGATIVE
Blood, UA: NEGATIVE
Glucose, UA: NEGATIVE mg/dL
Ketones, POC UA: NEGATIVE mg/dL
Leukocytes, UA: NEGATIVE
Nitrite, UA: NEGATIVE
POC PROTEIN,UA: NEGATIVE
Spec Grav, UA: 1.02 (ref 1.010–1.025)
Urobilinogen, UA: 0.2 E.U./dL
pH, UA: 5.5 (ref 5.0–8.0)

## 2019-11-18 LAB — POCT UA - MICROALBUMIN
Albumin/Creatinine Ratio, Urine, POC: <30
Creatinine, POC: 100 mg/dL
Microalbumin Ur, POC: 10 mg/L

## 2019-11-18 LAB — POCT GLYCOSYLATED HEMOGLOBIN (HGB A1C): Hemoglobin A1C: 5.8 % — AB (ref 4.0–5.6)

## 2019-11-18 NOTE — Assessment & Plan Note (Addendum)
Well controlled. Continue current regimen. Follow up in  3-4 months.  We will get up-to-date urine microalbumin as well.  She sees Dr. Domingo Cocking in [place] to have her eye exam so will call to get the most recent report.

## 2019-11-18 NOTE — Patient Instructions (Signed)
Okay to cut your hydrochlorothiazide and in half if you would like as long as you are able to check your blood pressure at home and keep an eye on it.  Your diastolic was a little bit on the low end today.

## 2019-11-18 NOTE — Assessment & Plan Note (Signed)
Discussed may be working with a therapist or counselor I think it could really be helpful for her she is the primary caregiver for her husband who unfortunately continues to decline.  Also dealing with stressors from other family members.  She would like to meet in person if at all possible.  Referral placed today.

## 2019-11-18 NOTE — Progress Notes (Signed)
Established Patient Office Visit  Subjective:  Patient ID: Penny Bauer, female    DOB: 02/02/1950  Age: 69 y.o. MRN: [id number]  CC:  Chief Complaint  Patient presents with  . Hypertension  . Diabetes    HPI Penny Bauer presents for   Diabetes - no hypoglycemic events. No wounds or sores that are not healing well. No increased thirst or urination. Checking glucose at home. Taking medications as prescribed without any side effects.  Says she has not been eating a lot because of increased stress.  In fact she is down about 8 pounds because of.  Hypothyroidism - Taking medication regularly in the AM away from food and vitamins, etc. No recent change to skin, hair, or energy levels.  She has been under a lot of stress recently.  Her daughter who recently divorced from her husband has been living with them and her behavior has been a bit erratic.  Though she will be moving to an apartment soon but it has been very stressful for her and her husband.  She knows her husband is also not doing well in regards to his health and that has been really emotionally a struggle as well.    Past Medical History:  Diagnosis Date  . Adenocarcinoma ([place])   . Controlled type 2 diabetes mellitus without complication, without long-term current use of insulin ([place]) 03/07/2017  . DEFICIENCY, VITAMIN D NOS 01/18/2006   Qualifier: Diagnosis of  By: Madilyn Fireman MD, Barnetta Chapel    . Endometrial cancer ([place])   . Endometrial carcinoma ([place]) 09/06/2012   Endometrial biopsy performed on 08/27/2012. Final pathology revealed a patchy a grade 2 endometrial adenocarcinoma. She was referred to Dr. Genia Del at [facility].   Marland Kitchen HYPERCHOLESTEROLEMIA 01/18/2006   Qualifier: Diagnosis of  By: Madilyn Fireman MD, Barnetta Chapel    . Hyperlipidemia   . Hypertension   . Hypothyroid   . Hypothyroidism 09/05/2012  . Iatrogenic hypothyroidism 01/18/2006   Qualifier: Diagnosis of  By: Madilyn Fireman MD, Barnetta Chapel    . Lower leg edema  08/04/2010   On hctz for this(not for blood pressure).    . Palpitations 09/16/2009   Qualifier: Diagnosis of  By: Madilyn Fireman MD, Barnetta Chapel    . TACHYCARDIA, HX OF 01/18/2006   Qualifier: Diagnosis of  By: Madilyn Fireman MD, Barnetta Chapel    . Type II or unspecified type diabetes mellitus without mention of complication, not stated as uncontrolled 01/17/2013    Past Surgical History:  Procedure Laterality Date  . CARDIAC CATHETERIZATION  2001   30% blockage  . CHOLECYSTECTOMY  2003  . LAP-BAND surgery  April 2007  . ROBOTIC ASSISTED LAPAROSCOPIC HYSTERECTOMY AND SALPINGECTOMY  2014  . TUBAL LIGATION  1989    Family History  Problem Relation Age of Onset  . Pancreatic cancer Mother   . Depression Mother   . Throat cancer Brother   . Heart attack Father 25  . Hypertension Father   . Stroke Father   . Depression Daughter   . Diabetes Unknown        Grandfather  . Liver disease Sister        Fatty liver    Social History   Socioeconomic History  . Marital status: Married    Spouse name: Merry Proud  . Number of children: 3  . Years of education: Not on file  . Highest education level: Not on file  Occupational History  . Occupation: Therapist, sports    CommentTeacher, music   Tobacco Use  .  Smoking status: Never Smoker  . Smokeless tobacco: Never Used  Vaping Use  . Vaping Use: Never used  Substance and Sexual Activity  . Alcohol use: Yes    Comment: Rarely  . Drug use: No  . Sexual activity: Not on file  Other Topics Concern  . Not on file  Social History Narrative   Her daughers are Charma Igo and Wells Guiles Huminski. No regular exercise. Husband is disabled.   Social Determinants of Health   Financial Resource Strain:   . Difficulty of Paying Living Expenses: Not on file  Food Insecurity:   . Worried About Charity fundraiser in the Last Year: Not on file  . Ran Out of Food in the Last Year: Not on file  Transportation Needs:   . Lack of Transportation (Medical): Not on file   . Lack of Transportation (Non-Medical): Not on file  Physical Activity:   . Days of Exercise per Week: Not on file  . Minutes of Exercise per Session: Not on file  Stress:   . Feeling of Stress : Not on file  Social Connections:   . Frequency of Communication with Friends and Family: Not on file  . Frequency of Social Gatherings with Friends and Family: Not on file  . Attends Religious Services: Not on file  . Active Member of Clubs or Organizations: Not on file  . Attends Archivist Meetings: Not on file  . Marital Status: Not on file  Intimate Partner Violence:   . Fear of Current or Ex-Partner: Not on file  . Emotionally Abused: Not on file  . Physically Abused: Not on file  . Sexually Abused: Not on file    Outpatient Medications Prior to Visit  Medication Sig Dispense Refill  . ACCU-CHEK FASTCLIX LANCETS MISC U TO TEST BLOOD SUGAR DAILY  0  . ACCU-CHEK GUIDE test strip DM E11.9 Check fasting blood sugar daily and once 2 hours after largest meal of the day as needed. 100 each prn  . aspirin 81 MG tablet Take 81 mg by mouth daily.      Marland Kitchen atorvastatin (LIPITOR) 20 MG tablet TAKE 1 AND 1/2 TABLETS EVERY DAY(30 mg total) 135 tablet 3  . blood glucose meter kit and supplies KIT Dispense based on patient and insurance preference. Use up to once a day as directed. (FOR ICD-9 250.00, 250.01). 1 each 0  . hydrochlorothiazide (HYDRODIURIL) 25 MG tablet Take 1 tablet (25 mg total) by mouth daily. 90 tablet 3  . levothyroxine (SYNTHROID) 150 MCG tablet Take 1 tablet (150 mcg total) by mouth daily before breakfast. 90 tablet 1  . metFORMIN (GLUCOPHAGE-XR) 500 MG 24 hr tablet Take 2 tablets (1,000 mg total) by mouth daily with breakfast. 180 tablet 3  . metoprolol succinate (TOPROL-XL) 100 MG 24 hr tablet TAKE 1 TABLET DAILY (TAKE WITH OR IMMEDIATELY FOLLOWING A MEAL) 90 tablet 1  . oxybutynin (DITROPAN) 5 MG tablet TAKE 1 TABLET TWICE DAILY 180 tablet 0   No facility-administered  medications prior to visit.    Allergies  Allergen Reactions  . Zocor [Simvastatin] Other (See Comments)    Leg cramping    ROS Review of Systems    Objective:    Physical Exam Constitutional:      Appearance: She is well-developed.  HENT:     Head: Normocephalic and atraumatic.  Cardiovascular:     Rate and Rhythm: Normal rate and regular rhythm.     Heart sounds: Normal heart sounds.  Pulmonary:     Effort: Pulmonary effort is normal.     Breath sounds: Normal breath sounds.  Skin:    General: Skin is warm and dry.  Neurological:     Mental Status: She is alert and oriented to person, place, and time.  Psychiatric:        Behavior: Behavior normal.     BP (!) 123/55   Pulse (!) 58   Ht 5' 7"  (1.702 m)   Wt 237 lb (107.5 kg)   SpO2 94%   BMI 37.12 kg/m  Wt Readings from Last 3 Encounters:  11/18/19 237 lb (107.5 kg)  05/15/19 245 lb (111.1 kg)  06/04/18 235 lb (106.6 kg)     Health Maintenance Due  Topic Date Due  . DEXA SCAN  Never done  . OPHTHALMOLOGY EXAM  01/10/2018  . FOOT EXAM  06/06/2018    There are no preventive care reminders to display for this patient.  Lab Results  Component Value Date   TSH 0.44 05/15/2019   Lab Results  Component Value Date   WBC 6.1 07/17/2018   HGB 13.2 07/17/2018   HCT 40.3 07/17/2018   MCV 92.2 07/17/2018   PLT 251 07/17/2018   Lab Results  Component Value Date   NA 141 05/15/2019   K 4.6 05/15/2019   CO2 30 05/15/2019   GLUCOSE 106 (H) 05/15/2019   BUN 13 05/15/2019   CREATININE 0.71 05/15/2019   BILITOT 0.6 07/17/2018   ALKPHOS 84 02/08/2016   AST 13 07/17/2018   ALT 15 07/17/2018   PROT 6.8 07/17/2018   ALBUMIN 3.7 02/08/2016   CALCIUM 10.0 05/15/2019   Lab Results  Component Value Date   CHOL 129 07/17/2018   Lab Results  Component Value Date   HDL 42 (L) 07/17/2018   Lab Results  Component Value Date   LDLCALC 70 07/17/2018   Lab Results  Component Value Date   TRIG 83  07/17/2018   Lab Results  Component Value Date   CHOLHDL 3.1 07/17/2018   Lab Results  Component Value Date   HGBA1C 5.8 (A) 11/18/2019      Assessment & Plan:   Problem List Items Addressed This Visit      Endocrine   Controlled type 2 diabetes mellitus without complication, without long-term current use of insulin ([place]) - Primary    Well controlled. Continue current regimen. Follow up in  3-4 months.  We will get up-to-date urine microalbumin as well.  She sees Dr. Domingo Cocking in [place] to have her eye exam so will call to get the most recent report.       Relevant Orders   COMPLETE METABOLIC PANEL WITH GFR   Lipid panel   CBC   TSH   POCT glycosylated hemoglobin (Hb A1C) (Completed)   POCT URINALYSIS DIP (CLINITEK) (Completed)   POCT UA - Microalbumin (Completed)     Other   Stress    Discussed may be working with a therapist or counselor I think it could really be helpful for her she is the primary caregiver for her husband who unfortunately continues to decline.  Also dealing with stressors from other family members.  She would like to meet in person if at all possible.  Referral placed today.      Relevant Orders   Ambulatory referral to [facility]    Other Visit Diagnoses    Abnormal urine odor       Relevant Orders   POCT URINALYSIS  DIP (CLINITEK) (Completed)      Abnormal urine odor-dipstick looks normal today we will keep an eye on symptoms if she develops any dysuria or other changes please let us know immediately.   No orders of the defined types were placed in this encounter.   Follow-up: Return in about 3 months (around 02/18/2020) for Diabetes follow-up.    [name], MD

## 2019-11-21 ENCOUNTER — Other Ambulatory Visit: Payer: Self-pay | Admitting: Family Medicine

## 2019-11-21 DIAGNOSIS — E032 Hypothyroidism due to medicaments and other exogenous substances: Secondary | ICD-10-CM

## 2019-12-18 DIAGNOSIS — C541 Malignant neoplasm of endometrium: Secondary | ICD-10-CM | POA: Diagnosis not present

## 2020-02-09 ENCOUNTER — Other Ambulatory Visit: Payer: Self-pay | Admitting: Family Medicine

## 2020-02-17 ENCOUNTER — Ambulatory Visit: Payer: Medicare HMO | Admitting: Family Medicine

## 2020-03-05 DIAGNOSIS — E119 Type 2 diabetes mellitus without complications: Secondary | ICD-10-CM | POA: Diagnosis not present

## 2020-03-06 LAB — LIPID PANEL
Cholesterol: 142 mg/dL (ref <200)
HDL: 46 mg/dL — ABNORMAL LOW (ref >=50)
LDL Cholesterol (Calc): 79 mg/dL (calc)
Non-HDL Cholesterol (Calc): 96 mg/dL (calc) (ref <130)
Total CHOL/HDL Ratio: 3.1 (calc) (ref <5.0)
Triglycerides: 90 mg/dL (ref <150)

## 2020-03-06 LAB — COMPLETE METABOLIC PANEL WITH GFR
AG Ratio: 1.5 (calc) (ref 1.0–2.5)
ALT: 11 U/L (ref 6–29)
AST: 11 U/L (ref 10–35)
Albumin: 4 g/dL (ref 3.6–5.1)
Alkaline phosphatase (APISO): 83 U/L (ref 37–153)
BUN: 15 mg/dL (ref 7–25)
CO2: 34 mmol/L — ABNORMAL HIGH (ref 20–32)
Calcium: 9.5 mg/dL (ref 8.6–10.4)
Chloride: 102 mmol/L (ref 98–110)
Creat: 0.64 mg/dL (ref 0.60–0.93)
GFR, Est African American: 104 mL/min/{1.73_m2} (ref >=60)
GFR, Est Non African American: 90 mL/min/{1.73_m2} (ref >=60)
Globulin: 2.7 g/dL (calc) (ref 1.9–3.7)
Glucose, Bld: 114 mg/dL — ABNORMAL HIGH (ref 65–99)
Potassium: 4.2 mmol/L (ref 3.5–5.3)
Sodium: 143 mmol/L (ref 135–146)
Total Bilirubin: 0.6 mg/dL (ref 0.2–1.2)
Total Protein: 6.7 g/dL (ref 6.1–8.1)

## 2020-03-06 LAB — CBC
HCT: 40.3 % (ref 35.0–45.0)
Hemoglobin: 13.3 g/dL (ref 11.7–15.5)
MCH: 30.7 pg (ref 27.0–33.0)
MCHC: 33 g/dL (ref 32.0–36.0)
MCV: 93.1 fL (ref 80.0–100.0)
MPV: 11.6 fL (ref 7.5–12.5)
Platelets: 250 10*3/uL (ref 140–400)
RBC: 4.33 10*6/uL (ref 3.80–5.10)
RDW: 11.7 % (ref 11.0–15.0)
WBC: 6.5 10*3/uL (ref 3.8–10.8)

## 2020-03-06 LAB — TSH: TSH: 1.87 mIU/L (ref 0.40–4.50)

## 2020-03-08 ENCOUNTER — Telehealth: Payer: Self-pay | Admitting: Family Medicine

## 2020-03-08 ENCOUNTER — Other Ambulatory Visit: Payer: Self-pay

## 2020-03-08 ENCOUNTER — Ambulatory Visit (INDEPENDENT_AMBULATORY_CARE_PROVIDER_SITE_OTHER): Payer: Medicare HMO | Admitting: Family Medicine

## 2020-03-08 ENCOUNTER — Encounter: Payer: Self-pay | Admitting: Family Medicine

## 2020-03-08 VITALS — BP 117/44 | HR 74 | Ht 67.0 in | Wt 231.0 lb

## 2020-03-08 DIAGNOSIS — E032 Hypothyroidism due to medicaments and other exogenous substances: Secondary | ICD-10-CM | POA: Diagnosis not present

## 2020-03-08 DIAGNOSIS — R600 Localized edema: Secondary | ICD-10-CM | POA: Diagnosis not present

## 2020-03-08 DIAGNOSIS — Z780 Asymptomatic menopausal state: Secondary | ICD-10-CM

## 2020-03-08 DIAGNOSIS — E119 Type 2 diabetes mellitus without complications: Secondary | ICD-10-CM | POA: Diagnosis not present

## 2020-03-08 DIAGNOSIS — R002 Palpitations: Secondary | ICD-10-CM

## 2020-03-08 LAB — POCT GLYCOSYLATED HEMOGLOBIN (HGB A1C): Hemoglobin A1C: 6 % — AB (ref 4.0–5.6)

## 2020-03-08 NOTE — Assessment & Plan Note (Signed)
Okay for her to try dropping the HCTZ down to half a tab daily certainly if she feels like she starts swelling again she can always go back up but blood pressures are on the lower end.

## 2020-03-08 NOTE — Progress Notes (Signed)
Established Patient Office Visit  Subjective:  Patient ID: Penny Bauer, female    DOB: [birth year]  Age: 70 y.o. MRN: [id number]  CC:  Chief Complaint  Patient presents with  . ifg    HPI Penny Bauer presents for   Diabetes - no hypoglycemic events. No wounds or sores that are not healing well. No increased thirst or urination. Checking glucose at home. Taking medications as prescribed without any side effects.  She has been taking a half a tab of her hydrochlorothiazide every other day and a whole tab in between.  She was originally started on the medication for swelling but her blood pressures were actually quite low when she was here previously.  She went for labs on Friday would like to go over those results today.  She just had a lot of stress on her and helping take care of her husband's condition seems to be worsening.  He is requiring a lot more care she has been trying to encourage him to get up and move and walk around at least some during the day but she just feels exhausted at times.  Her daughter who has been struggling with substance abuse is currently in a rehab program and will be there for about a month somewhat some ways it has actually provided some relief for her.  Though she is helping to take care of her daughters 3 dogs and other pets.  BMI 36-she has been working on weight loss and is actually down about 6 pounds.  As her hip is really beginning to bother her so it makes it a little harder to exercise  Past Medical History:  Diagnosis Date  . Adenocarcinoma ([place])   . Controlled type 2 diabetes mellitus without complication, without long-term current use of insulin ([place]) 03/07/2017  . DEFICIENCY, VITAMIN D NOS 01/18/2006   Qualifier: Diagnosis of  By: Madilyn Fireman MD, Barnetta Chapel    . Endometrial cancer ([place])   . Endometrial carcinoma ([place]) 09/06/2012   Endometrial biopsy performed on 08/27/2012. Final pathology revealed a patchy a grade 2 endometrial  adenocarcinoma. She was referred to Dr. Genia Del at [facility].   Marland Kitchen HYPERCHOLESTEROLEMIA 01/18/2006   Qualifier: Diagnosis of  By: Madilyn Fireman MD, Barnetta Chapel    . Hyperlipidemia   . Hypertension   . Hypothyroid   . Hypothyroidism 09/05/2012  . Iatrogenic hypothyroidism 01/18/2006   Qualifier: Diagnosis of  By: Madilyn Fireman MD, Barnetta Chapel    . Lower leg edema 08/04/2010   On hctz for this(not for blood pressure).    . Palpitations 09/16/2009   Qualifier: Diagnosis of  By: Madilyn Fireman MD, Barnetta Chapel    . TACHYCARDIA, HX OF 01/18/2006   Qualifier: Diagnosis of  By: Madilyn Fireman MD, Barnetta Chapel    . Type II or unspecified type diabetes mellitus without mention of complication, not stated as uncontrolled 01/17/2013    Past Surgical History:  Procedure Laterality Date  . CARDIAC CATHETERIZATION  2001   30% blockage  . CHOLECYSTECTOMY  2003  . LAP-BAND surgery  April 2007  . ROBOTIC ASSISTED LAPAROSCOPIC HYSTERECTOMY AND SALPINGECTOMY  2014  . TUBAL LIGATION  1989    Family History  Problem Relation Age of Onset  . Pancreatic cancer Mother   . Depression Mother   . Throat cancer Brother   . Heart attack Father 13  . Hypertension Father   . Stroke Father   . Depression Daughter   . Diabetes Unknown        Grandfather  .  Liver disease Sister        Fatty liver    Social History   Socioeconomic History  . Marital status: Married    Spouse name: Merry Proud  . Number of children: 3  . Years of education: Not on file  . Highest education level: Not on file  Occupational History  . Occupation: Therapist, sports    CommentTeacher, music   Tobacco Use  . Smoking status: Never Smoker  . Smokeless tobacco: Never Used  Vaping Use  . Vaping Use: Never used  Substance and Sexual Activity  . Alcohol use: Yes    Comment: Rarely  . Drug use: No  . Sexual activity: Not on file  Other Topics Concern  . Not on file  Social History Narrative   Her daughers are Charma Igo and Wells Guiles Huminski. No  regular exercise. Husband is disabled.   Social Determinants of Health   Financial Resource Strain: Not on file  Food Insecurity: Not on file  Transportation Needs: Not on file  Physical Activity: Not on file  Stress: Not on file  Social Connections: Not on file  Intimate Partner Violence: Not on file    Outpatient Medications Prior to Visit  Medication Sig Dispense Refill  . ACCU-CHEK FASTCLIX LANCETS MISC U TO TEST BLOOD SUGAR DAILY  0  . ACCU-CHEK GUIDE test strip DM E11.9 Check fasting blood sugar daily and once 2 hours after largest meal of the day as needed. 100 each prn  . aspirin 81 MG tablet Take 81 mg by mouth daily.    Marland Kitchen atorvastatin (LIPITOR) 20 MG tablet TAKE 1 AND 1/2 TABLETS EVERY DAY(30 mg total) 135 tablet 3  . blood glucose meter kit and supplies KIT Dispense based on patient and insurance preference. Use up to once a day as directed. (FOR ICD-9 250.00, 250.01). 1 each 0  . hydrochlorothiazide (HYDRODIURIL) 25 MG tablet Take 1 tablet (25 mg total) by mouth daily. 90 tablet 3  . levothyroxine (SYNTHROID) 150 MCG tablet TAKE 1 TABLET (150 MCG TOTAL) BY MOUTH DAILY BEFORE BREAKFAST. 90 tablet 1  . metFORMIN (GLUCOPHAGE-XR) 500 MG 24 hr tablet Take 2 tablets (1,000 mg total) by mouth daily with breakfast. 180 tablet 3  . metoprolol succinate (TOPROL-XL) 100 MG 24 hr tablet TAKE 1 TABLET DAILY (TAKE WITH OR IMMEDIATELY FOLLOWING A MEAL) 90 tablet 0  . oxybutynin (DITROPAN) 5 MG tablet TAKE 1 TABLET TWICE DAILY 180 tablet 0   No facility-administered medications prior to visit.    Allergies  Allergen Reactions  . Zocor [Simvastatin] Other (See Comments)    Leg cramping    ROS Review of Systems    Objective:    Physical Exam Constitutional:      Appearance: She is well-developed and well-nourished.  HENT:     Head: Normocephalic and atraumatic.  Cardiovascular:     Rate and Rhythm: Normal rate and regular rhythm.     Heart sounds: Normal heart sounds.   Pulmonary:     Effort: Pulmonary effort is normal.     Breath sounds: Normal breath sounds.  Skin:    General: Skin is warm and dry.  Neurological:     Mental Status: She is alert and oriented to person, place, and time.  Psychiatric:        Mood and Affect: Mood and affect normal.        Behavior: Behavior normal.     BP (!) 117/44   Pulse 74   Ht 5'  7" (1.702 m)   Wt 231 lb (104.8 kg)   SpO2 97%   BMI 36.18 kg/m  Wt Readings from Last 3 Encounters:  03/08/20 231 lb (104.8 kg)  11/18/19 237 lb (107.5 kg)  05/15/19 245 lb (111.1 kg)     Health Maintenance Due  Topic Date Due  . DEXA SCAN  Never done  . OPHTHALMOLOGY EXAM  01/10/2018  . FOOT EXAM  06/06/2018  . COVID-19 Vaccine (3 - Pfizer risk 4-dose series) 04/28/2019    There are no preventive care reminders to display for this patient.  Lab Results  Component Value Date   TSH 1.87 03/05/2020   Lab Results  Component Value Date   WBC 6.5 03/05/2020   HGB 13.3 03/05/2020   HCT 40.3 03/05/2020   MCV 93.1 03/05/2020   PLT 250 03/05/2020   Lab Results  Component Value Date   NA 143 03/05/2020   K 4.2 03/05/2020   CO2 34 (H) 03/05/2020   GLUCOSE 114 (H) 03/05/2020   BUN 15 03/05/2020   CREATININE 0.64 03/05/2020   BILITOT 0.6 03/05/2020   ALKPHOS 84 02/08/2016   AST 11 03/05/2020   ALT 11 03/05/2020   PROT 6.7 03/05/2020   ALBUMIN 3.7 02/08/2016   CALCIUM 9.5 03/05/2020   Lab Results  Component Value Date   CHOL 142 03/05/2020   Lab Results  Component Value Date   HDL 46 (L) 03/05/2020   Lab Results  Component Value Date   LDLCALC 79 03/05/2020   Lab Results  Component Value Date   TRIG 90 03/05/2020   Lab Results  Component Value Date   CHOLHDL 3.1 03/05/2020   Lab Results  Component Value Date   HGBA1C 6.0 (A) 03/08/2020      Assessment & Plan:   Problem List Items Addressed This Visit      Endocrine   Iatrogenic hypothyroidism    Thyroid levels look great on labs.   Continue current regimen.  Follow-up in 6 to 12 months.      Controlled type 2 diabetes mellitus without complication, without long-term current use of insulin ([place]) - Primary    Well controlled. Continue current regimen. Follow up in  6 mo       Relevant Orders   POCT glycosylated hemoglobin (Hb A1C) (Completed)     Other   PALPITATIONS    Well-controlled on metoprolol.      Lower leg edema    Okay for her to try dropping the HCTZ down to half a tab daily certainly if she feels like she starts swelling again she can always go back up but blood pressures are on the lower end.       Other Visit Diagnoses    Post-menopausal       Relevant Orders   DG Bone Density       No orders of the defined types were placed in this encounter.   Follow-up: Return in about 6 months (around 09/05/2020) for prediabetes.    [name], MD

## 2020-03-08 NOTE — Assessment & Plan Note (Signed)
Well controlled on metoprolol.

## 2020-03-08 NOTE — Assessment & Plan Note (Signed)
Well controlled. Continue current regimen. Follow up in  6 mo  

## 2020-03-08 NOTE — Patient Instructions (Signed)
Ok to cut your diuretic in half.

## 2020-03-08 NOTE — Assessment & Plan Note (Signed)
Thyroid levels look great on labs.  Continue current regimen.  Follow-up in 6 to 12 months.

## 2020-03-08 NOTE — Telephone Encounter (Signed)
My chart note sent regarding hip pain.

## 2020-03-16 ENCOUNTER — Telehealth: Payer: Self-pay | Admitting: Family Medicine

## 2020-03-16 NOTE — Telephone Encounter (Signed)
Please call patient and remind her to schedule eye exam. We actually do have an eye doctor in her building if she would like [place] to place referral to him or if she wants to get back in with Dr. Domingo Cocking that is perfectly fine. It has been several years since we had one on file for her.

## 2020-03-19 NOTE — Telephone Encounter (Signed)
Pt has an appt on 3/10. She will have the results sent.

## 2020-04-21 ENCOUNTER — Encounter: Payer: Self-pay | Admitting: Family Medicine

## 2020-05-10 ENCOUNTER — Other Ambulatory Visit: Payer: Self-pay | Admitting: Family Medicine

## 2020-05-10 DIAGNOSIS — E032 Hypothyroidism due to medicaments and other exogenous substances: Secondary | ICD-10-CM

## 2020-06-14 ENCOUNTER — Telehealth: Payer: Self-pay | Admitting: Family Medicine

## 2020-06-14 NOTE — Chronic Care Management (AMB) (Signed)
  Chronic Care Management   Outreach Note  06/14/2020 Name: SUSA BONES MRN: [id number] DOB: August 14, 1950  Referred by: Hali Marry, MD Reason for referral : No chief complaint on file.   An unsuccessful telephone outreach was attempted today. The patient was referred to the pharmacist for assistance with care management and care coordination.   Follow Up Plan:   Lauretta Grill Upstream Scheduler

## 2020-06-23 DIAGNOSIS — H524 Presbyopia: Secondary | ICD-10-CM | POA: Diagnosis not present

## 2020-06-23 DIAGNOSIS — H2513 Age-related nuclear cataract, bilateral: Secondary | ICD-10-CM | POA: Diagnosis not present

## 2020-06-23 DIAGNOSIS — E119 Type 2 diabetes mellitus without complications: Secondary | ICD-10-CM | POA: Diagnosis not present

## 2020-06-23 DIAGNOSIS — H43393 Other vitreous opacities, bilateral: Secondary | ICD-10-CM | POA: Diagnosis not present

## 2020-06-23 LAB — HM DIABETES EYE EXAM

## 2020-06-30 ENCOUNTER — Telehealth: Payer: Self-pay | Admitting: Family Medicine

## 2020-06-30 NOTE — Chronic Care Management (AMB) (Signed)
  Chronic Care Management   Note  06/30/2020 Name: Penny Bauer MRN: [id number] DOB: April 15, 1950  Penny Bauer is a 70 y.o. year old female who is a primary care patient of Madilyn Fireman, Rene Kocher, MD. I reached out to [name] by phone today in response to a referral sent by Penny Bauer PCP, Hali Marry, MD.   Penny Bauer was given information about Chronic Care Management services today including:  1. CCM service includes personalized support from designated clinical staff supervised by her physician, including individualized plan of care and coordination with other care providers 2. 24/7 contact phone numbers for assistance for urgent and routine care needs. 3. Service will only be billed when office clinical staff spend 20 minutes or more in a month to coordinate care. 4. Only one practitioner may furnish and bill the service in a calendar month. 5. The patient may stop CCM services at any time (effective at the end of the month) by phone call to the office staff.   Patient agreed to services and verbal consent obtained.   Follow up plan:   Lauretta Grill Upstream Scheduler

## 2020-07-06 ENCOUNTER — Encounter: Payer: Self-pay | Admitting: Family Medicine

## 2020-08-06 ENCOUNTER — Telehealth: Payer: Self-pay | Admitting: Pharmacist

## 2020-08-06 NOTE — Chronic Care Management (AMB) (Signed)
    Chronic Care Management Pharmacy Assistant   Name: [name]  MRN: [id number] DOB: Mar 11, 1950  Penny Bauer is an 70 y.o. year old female who presents for her initial CCM visit with the clinical pharmacist.  Reason for Encounter: Initial CCM Visit   Recent office visits:  03/08/20 [name], MD PCP -Seen for General follow up.Decrease HCTZ to half a tablet daily.Continue current medications. Follow up in 6 months.  Recent consult visits:  None noted  Hospital visits:  None in previous 6 months  Medications: Outpatient Encounter Medications as of 08/06/2020  Medication Sig   ACCU-CHEK FASTCLIX LANCETS MISC U TO TEST BLOOD SUGAR DAILY   ACCU-CHEK GUIDE test strip DM E11.9 Check fasting blood sugar daily and once 2 hours after largest meal of the day as needed.   aspirin 81 MG tablet Take 81 mg by mouth daily.   atorvastatin (LIPITOR) 20 MG tablet TAKE 1 AND 1/2 TABLETS EVERY DAY   blood glucose meter kit and supplies KIT Dispense based on patient and insurance preference. Use up to once a day as directed. (FOR ICD-9 250.00, 250.01).   hydrochlorothiazide (HYDRODIURIL) 25 MG tablet TAKE 1 TABLET (25 MG TOTAL) BY MOUTH DAILY.   levothyroxine (SYNTHROID) 150 MCG tablet TAKE 1 TABLET (150 MCG TOTAL) BY MOUTH DAILY BEFORE BREAKFAST.   metFORMIN (GLUCOPHAGE-XR) 500 MG 24 hr tablet Take 2 tablets (1,000 mg total) by mouth daily with breakfast.   metoprolol succinate (TOPROL-XL) 100 MG 24 hr tablet TAKE 1 TABLET DAILY (TAKE WITH OR IMMEDIATELY FOLLOWING A MEAL)   oxybutynin (DITROPAN) 5 MG tablet TAKE 1 TABLET TWICE DAILY   No facility-administered encounter medications on file as of 08/06/2020.   Medications: aspirin 81 MG tablet atorvastatin (LIPITOR) 20 MG tablet last filled 05/11/20 90 DS hydrochlorothiazide (HYDRODIURIL) 25 MG tablet last filled 05/11/20 90 DS levothyroxine (SYNTHROID) 150 MCG tablet last filled 05/11/20 90 DS metFORMIN (GLUCOPHAGE-XR) 500 MG 24 hr  tablet last filled 02/09/20 90 DS metoprolol succinate (TOPROL-XL) 100 MG 24 hr tablet last filled 05/11/20 90 DS oxybutynin (DITROPAN) 5 MG tablet last filled 03/06/19 90 DS     Star Rating Drugs: atorvastatin (LIPITOR) 20 MG tablet last filled 05/11/20 90 DS metFORMIN (GLUCOPHAGE-XR) 500 MG 24 hr tablet last filled 02/09/20 90 DS  [facility] Pharmacist Assistant [phone]

## 2020-08-09 ENCOUNTER — Ambulatory Visit (INDEPENDENT_AMBULATORY_CARE_PROVIDER_SITE_OTHER): Payer: Medicare HMO | Admitting: Pharmacist

## 2020-08-09 ENCOUNTER — Other Ambulatory Visit: Payer: Self-pay

## 2020-08-09 DIAGNOSIS — E119 Type 2 diabetes mellitus without complications: Secondary | ICD-10-CM | POA: Diagnosis not present

## 2020-08-09 DIAGNOSIS — E7800 Pure hypercholesterolemia, unspecified: Secondary | ICD-10-CM

## 2020-08-09 NOTE — Patient Instructions (Signed)
Visit Information   PATIENT GOALS:   Goals Addressed             This Visit's Progress    Medication Managment       Patient Goals/Self-Care Activities Over the next 365 days, patient will:  take medications as prescribed  Follow Up Plan: Telephone follow up appointment with care management team member scheduled for:  1 year          Consent to CCM Services: Penny Bauer was given information about Chronic Care Management services today including:  CCM service includes personalized support from designated clinical staff supervised by her physician, including individualized plan of care and coordination with other care providers 24/7 contact phone numbers for assistance for urgent and routine care needs. Service will only be billed when office clinical staff spend 20 minutes or more in a month to coordinate care. Only one practitioner may furnish and bill the service in a calendar month. The patient may stop CCM services at any time (effective at the end of the month) by phone call to the office staff. The patient will be responsible for cost sharing (co-pay) of up to 20% of the service fee (after annual deductible is met).  Patient agreed to services and verbal consent obtained.   Patient verbalizes understanding of instructions provided today and agrees to view in [place].   Telephone follow up appointment with care management team member scheduled for: 1 year   CLINICAL CARE PLAN: Patient Care Plan: General Pharmacy (Adult)     Problem Identified: DM, HLD, leg edema      Long-Range Goal: Disease Progression Prevention   Start Date: 08/09/2020  This Visit's Progress: On track  Priority: High  Note:   Current Barriers:  None at present  Pharmacist Clinical Goal(s):  Over the next 365 days, patient will adhere to prescribed medication regimen as evidenced by medication fill history through collaboration with PharmD and provider.   Interventions: 1:1 collaboration  with Hali Marry, MD regarding development and update of comprehensive plan of care as evidenced by provider attestation and co-signature Inter-disciplinary care team collaboration (see longitudinal plan of care) Comprehensive medication review performed; medication list updated in electronic medical record  Diabetes:  Controlled; current treatment:metformin 1g daily; a1c 6   Current glucose readings: fasting glucose: <120s, every few weeks  Denies hypoglycemic/hyperglycemic symptoms  Current meal patterns: breakfast: eggs + muffin, or apple puffs; lunch: dinner leftovers, glucerna shake; dinner: cube steak,vegetable, carrots; snacks: whatever is in fridge; drinks: unsweet ice tea  Current exercise: some walks w/ dog  Recommended continue current regimen,  Hyperlipidemia:  Controlled; current treatment:atorvastatin 59m ;   Medications previously tried: simvastatin   Recommended continue current regimen  Lower Leg edema  Controlled; hctz 12.541mto 2519mvaries in dose per day);   Counseled on low blood pressure s/sx, advised to notify PCP if <100/70 and symptomatic  Recommended continue current regimen   Patient Goals/Self-Care Activities Over the next 365 days, patient will:  take medications as prescribed  Follow Up Plan: Telephone follow up appointment with care management team member scheduled for:  1 year

## 2020-08-09 NOTE — Progress Notes (Signed)
Chronic Care Management Pharmacy Note  08/09/2020 Name:  Penny Bauer MRN:  [id number] DOB:  06/25/1950  Summary: addressed DM, HLD, leg edema (+ occasional hypotension from medications hctz & metoprolol)  Recommendations/Changes made from today's visit: none  Plan: f/u with pharmacist in 1 yr  Subjective: Penny Bauer is an 70 y.o. year old female who is a primary patient of Metheney, Rene Kocher, MD.  The CCM team was consulted for assistance with disease management and care coordination needs.    Engaged with patient by telephone for initial visit in response to provider referral for pharmacy case management and/or care coordination services.   Consent to Services:  The patient was given information about Chronic Care Management services, agreed to services, and gave verbal consent prior to initiation of services.  Please see initial visit note for detailed documentation.   Patient Care Team: Hali Marry, MD as PCP - General Dr. Genia Del (Oncology) Jimmie Molly, OD (Optometry) Darius Bump, [facility] as Pharmacist (Pharmacist)  Recent office visits:  03/08/20 [name], MD PCP -Seen for General follow up.Decrease HCTZ to half a tablet daily.Continue current medications. Follow up in 6 months.   Recent consult visits:  None noted   Hospital visits:  None in previous 6 months  Objective:  Lab Results  Component Value Date   CREATININE 0.64 03/05/2020   CREATININE 0.71 05/15/2019   CREATININE 0.72 07/17/2018    Lab Results  Component Value Date   HGBA1C 6.0 (A) 03/08/2020   Last diabetic Eye exam:  Lab Results  Component Value Date/Time   HMDIABEYEEXA No Retinopathy 06/23/2020 12:00 AM    Last diabetic Foot exam: No results found for: HMDIABFOOTEX      Component Value Date/Time   CHOL 142 03/05/2020 0836   TRIG 90 03/05/2020 0836   TRIG 125 02/18/2010 0000   HDL 46 (L) 03/05/2020 0836   CHOLHDL 3.1 03/05/2020 0836   VLDL 29  02/08/2016 0851   LDLCALC 79 03/05/2020 0836    Hepatic Function Latest Ref Rng & Units 03/05/2020 07/17/2018 10/17/2017  Total Protein 6.1 - 8.1 g/dL 6.7 6.8 6.7  Albumin 3.6 - 5.1 g/dL - - -  AST 10 - 35 U/L 11 13 16   ALT 6 - 29 U/L 11 15 23   Alk Phosphatase 33 - 130 U/L - - -  Total Bilirubin 0.2 - 1.2 mg/dL 0.6 0.6 0.7    Lab Results  Component Value Date/Time   TSH 1.87 03/05/2020 08:36 AM   TSH 0.44 05/15/2019 01:49 PM    CBC Latest Ref Rng & Units 03/05/2020 07/17/2018 06/13/2017  WBC 3.8 - 10.8 Thousand/uL 6.5 6.1 6.1  Hemoglobin 11.7 - 15.5 g/dL 13.3 13.2 14.6  Hematocrit 35.0 - 45.0 % 40.3 40.3 43.1  Platelets 140 - 400 Thousand/uL 250 251 285    Social History   Tobacco Use  Smoking Status Never  Smokeless Tobacco Never   BP Readings from Last 3 Encounters:  03/08/20 (!) 117/44  11/18/19 (!) 123/55  05/15/19 126/65   Pulse Readings from Last 3 Encounters:  03/08/20 74  11/18/19 (!) 58  05/15/19 70   Wt Readings from Last 3 Encounters:  03/08/20 231 lb (104.8 kg)  11/18/19 237 lb (107.5 kg)  05/15/19 245 lb (111.1 kg)    Assessment: Review of patient past medical history, allergies, medications, health status, including review of consultants reports, laboratory and other test data, was performed as part of comprehensive evaluation and provision of  chronic care management services.   SDOH:  (Social Determinants of Health) assessments and interventions performed:    CCM Care Plan  Allergies  Allergen Reactions   Zocor [Simvastatin] Other (See Comments)    Leg cramping    Medications Reviewed Today     Reviewed by Darius Bump, [facility] (Pharmacist) on 08/09/20 at 1121  Med List Status: <None>   Medication Order Taking? Sig Documenting Provider Last Dose Status Informant  ACCU-CHEK FASTCLIX LANCETS [place] [id number] Yes U TO TEST BLOOD SUGAR DAILY [provider] Taking Active   ACCU-CHEK GUIDE test strip [id number] Yes DM E11.9 Check fasting blood  sugar daily and once 2 hours after largest meal of the day as needed. Hali Marry, MD Taking Active   aspirin 81 MG tablet [id number] Yes Take 81 mg by mouth daily. [provider] Taking Active   atorvastatin (LIPITOR) 20 MG tablet [id number] Yes TAKE 1 AND 1/2 TABLETS EVERY DAY Hali Marry, MD Taking Active   blood glucose meter kit and supplies KIT [id number] Yes Dispense based on patient and insurance preference. Use up to once a day as directed. (FOR ICD-9 250.00, 250.01). Hali Marry, MD Taking Active   hydrochlorothiazide (HYDRODIURIL) 25 MG tablet [id number] Yes TAKE 1 TABLET (25 MG TOTAL) BY MOUTH DAILY. Hali Marry, MD Taking Active            Med Note [name] Aug 09, 2020 11:19 AM) Some days full tablet, some days 1/2 tablet  levothyroxine (SYNTHROID) 150 MCG tablet [id number] Yes TAKE 1 TABLET (150 MCG TOTAL) BY MOUTH DAILY BEFORE BREAKFAST. Hali Marry, MD Taking Active   metFORMIN (GLUCOPHAGE-XR) 500 MG 24 hr tablet [id number] Yes Take 2 tablets (1,000 mg total) by mouth daily with breakfast. Hali Marry, MD Taking Active   metoprolol succinate (TOPROL-XL) 100 MG 24 hr tablet [id number] Yes TAKE 1 TABLET DAILY (TAKE WITH OR IMMEDIATELY FOLLOWING A MEAL) Hali Marry, MD Taking Active   naproxen sodium (ALEVE) 220 MG tablet [id number] Yes Take 440 mg by mouth at bedtime. [provider] Taking Active   oxybutynin (DITROPAN) 5 MG tablet [id number] Yes TAKE 1 TABLET TWICE DAILY Hali Marry, MD Taking Active            Med Note [name] Aug 09, 2020 11:20 AM) [name] daily instead of BID            Patient Active Problem List   Diagnosis Date Noted   Stress 11/18/2019   OSA (obstructive sleep apnea) 09/06/2017   Controlled type 2 diabetes mellitus without complication, without long-term current use of insulin ([place]) 03/07/2017   Urge incontinence of urine  07/04/2016   Pelvic fluid collection 01/16/2013   Endometrial carcinoma ([place]) 09/06/2012   Lower leg edema 08/04/2010   KNEE PAIN, RIGHT 09/16/2009   PALPITATIONS 09/16/2009   Iatrogenic hypothyroidism 01/18/2006   DEFICIENCY, VITAMIN D NOS 01/18/2006   HYPERCHOLESTEROLEMIA 01/18/2006   OBESITY NOS 01/18/2006   IRREGULAR MENSTRUATION 01/18/2006   TACHYCARDIA, HX OF 01/18/2006    Immunization History  Administered Date(s) Administered   Fluad Quad(high Dose 65+) 10/14/2018, 11/03/2019   Influenza Whole 10/30/2005, 11/23/2008   Influenza, High Dose Seasonal PF 09/20/2016, 10/18/2017   Influenza,inj,Quad PF,6+ Mos 10/16/2013, 12/07/2014, 09/17/2015   Influenza-Unspecified 11/20/2012   PFIZER(Purple Top)SARS-COV-2 Vaccination 03/06/2019, 03/31/2019   Pneumococcal Conjugate-13 10/16/2013   Pneumococcal Polysaccharide-23 01/03/2016   Td 09/30/2005   Tdap  05/17/2010   Varicella Zoster Immune Globulin 08/04/2010    Conditions to be addressed/monitored: HLD and DMII  Care Plan : General Pharmacy (Adult)  Updates made by Darius Bump, [place] since 08/09/2020 12:00 AM     Problem: DM, HLD, leg edema      Long-Range Goal: Disease Progression Prevention   Start Date: 08/09/2020  This Visit's Progress: On track  Priority: High  Note:   Current Barriers:  None at present  Pharmacist Clinical Goal(s):  Over the next 365 days, patient will adhere to prescribed medication regimen as evidenced by medication fill history through collaboration with PharmD and provider.   Interventions: 1:1 collaboration with Hali Marry, MD regarding development and update of comprehensive plan of care as evidenced by provider attestation and co-signature Inter-disciplinary care team collaboration (see longitudinal plan of care) Comprehensive medication review performed; medication list updated in electronic medical record  Diabetes:  Controlled; current treatment:metformin 1g daily; a1c 6    Current glucose readings: fasting glucose: <120s, every few weeks  Denies hypoglycemic/hyperglycemic symptoms  Current meal patterns: breakfast: eggs + muffin, or apple puffs; lunch: dinner leftovers, glucerna shake; dinner: cube steak,vegetable, carrots; snacks: whatever is in fridge; drinks: unsweet ice tea  Current exercise: some walks w/ dog  Recommended continue current regimen,  Hyperlipidemia:  Controlled; current treatment:atorvastatin 63m ;   Medications previously tried: simvastatin   Recommended continue current regimen  Lower Leg edema  Controlled; hctz 12.517mto 2546mvaries in dose per day);   Counseled on low blood pressure s/sx, advised to notify PCP if <100/70 and symptomatic  Recommended continue current regimen   Patient Goals/Self-Care Activities Over the next 365 days, patient will:  take medications as prescribed  Follow Up Plan: Telephone follow up appointment with care management team member scheduled for:  1 year      Medication Assistance: None required.  Patient affirms current coverage meets needs.  Patient's preferred pharmacy is:  [facility] - [address] [place] [id number]: [phone]: [phone] [id number] [id number]: [phone]: [phone] pill box? Yes Pt endorses 80% compliance  Follow Up:  Patient agrees to Care Plan and Follow-up.  Plan: Telephone follow up appointment with care management team member scheduled for:  1 yr  KeeDarius Bump

## 2020-09-04 ENCOUNTER — Other Ambulatory Visit: Payer: Self-pay | Admitting: Family Medicine

## 2020-09-07 ENCOUNTER — Ambulatory Visit: Payer: Medicare HMO | Admitting: Family Medicine

## 2020-09-14 ENCOUNTER — Ambulatory Visit: Payer: Medicare HMO | Admitting: Family Medicine

## 2020-10-13 ENCOUNTER — Other Ambulatory Visit: Payer: Self-pay

## 2020-10-13 ENCOUNTER — Ambulatory Visit (INDEPENDENT_AMBULATORY_CARE_PROVIDER_SITE_OTHER): Payer: Medicare HMO

## 2020-10-13 DIAGNOSIS — Z0000 Encounter for general adult medical examination without abnormal findings: Secondary | ICD-10-CM

## 2020-10-13 NOTE — Progress Notes (Addendum)
Virtual Visit via Telephone Note  I connected with  Penny Bauer on 10/13/20 at  5:00 PM EDT by telephone and verified that I am speaking with the correct person using two identifiers.  Medicare Annual Wellness visit completed telephonically due to Covid-19 pandemic.   Persons participating in this call: This Health Coach and this patient.   Location: Patient: home Provider: office   I discussed the limitations, risks, security and privacy concerns of performing an evaluation and management service by telephone and the availability of in person appointments. The patient expressed understanding and agreed to proceed.  Unable to perform video visit due to video visit attempted and failed and/or patient does not have video capability.   Some vital signs may be absent or patient reported.   Willette Brace, LPN   Subjective:   Penny Bauer is a 70 y.o. female who presents for an Initial Medicare Annual Wellness Visit.  Review of Systems     Cardiac Risk Factors include: advanced age (>3mn, >>75women)     Objective:    There were no vitals filed for this visit. There is no height or weight on file to calculate BMI.  Advanced Directives 10/13/2020  Does Patient Have a Medical Advance Directive? Yes  Type of [place]ive [place]? No - copy requested    Current Medications (verified) Outpatient Encounter Medications as of 10/13/2020  Medication Sig   ACCU-CHEK FASTCLIX LANCETS MISC U TO TEST BLOOD SUGAR DAILY   ACCU-CHEK GUIDE test strip DM E11.9 Check fasting blood sugar daily and once 2 hours after largest meal of the day as needed.   aspirin 81 MG tablet Take 81 mg by mouth daily.   atorvastatin (LIPITOR) 20 MG tablet TAKE 1 AND 1/2 TABLETS EVERY DAY   blood glucose meter kit and supplies KIT Dispense based on patient and insurance preference. Use up to once a day as directed. (FOR ICD-9 250.00,  250.01).   hydrochlorothiazide (HYDRODIURIL) 25 MG tablet TAKE 1 TABLET (25 MG TOTAL) BY MOUTH DAILY.   ibuprofen (ADVIL) 800 MG tablet Take 800 mg by mouth every 8 (eight) hours as needed.   levothyroxine (SYNTHROID) 150 MCG tablet TAKE 1 TABLET (150 MCG TOTAL) BY MOUTH DAILY BEFORE BREAKFAST.   metFORMIN (GLUCOPHAGE-XR) 500 MG 24 hr tablet Take 2 tablets (1,000 mg total) by mouth daily with breakfast.   metoprolol succinate (TOPROL-XL) 100 MG 24 hr tablet TAKE 1 TABLET DAILY (TAKE WITH OR IMMEDIATELY FOLLOWING A MEAL)   oxybutynin (DITROPAN) 5 MG tablet TAKE 1 TABLET TWICE DAILY   PFIZER-BIONT COVID-19 VAC-TRIS SUSP injection    [DISCONTINUED] naproxen sodium (ALEVE) 220 MG tablet Take 440 mg by mouth at bedtime. (Patient not taking: Reported on 10/13/2020)   No facility-administered encounter medications on file as of 10/13/2020.    Allergies (verified) Zocor [simvastatin]   History: Past Medical History:  Diagnosis Date   Adenocarcinoma ([place]    Controlled type 2 diabetes mellitus without complication, without long-term current use of insulin ([place] 03/07/2017   DEFICIENCY, VITAMIN D NOS 01/18/2006   Qualifier: Diagnosis of  By: MMadilyn FiremanMD, Catherine     [facility]    [place]ometrial carcinoma ([place] 09/06/2012   Endometrial biopsy performed on 08/27/2012. Final pathology revealed a patchy a grade 2 endometrial adenocarcinoma. [place]    HYPERCHOLESTEROLEMIA 01/18/2006   Qualifier: Diagnosis of  By:  [place] MD, Barnetta Chapel     Hyperlipidemia    Hypertension    Hypothyroid    Hypothyroidism 09/05/2012   Iatrogenic hypothyroidism 01/18/2006   Qualifier: Diagnosis of  By: Madilyn Fireman MD, Barnetta Chapel     Lower leg edema 08/04/2010   On hctz for this(not for blood pressure).     Palpitations 09/16/2009   Qualifier: Diagnosis of  By: Madilyn Fireman MD, Zettie Pho, HX OF 01/18/2006   Qualifier: Diagnosis of  By: Madilyn Fireman MD,  Catherine     Type II or unspecified type diabetes mellitus without mention of complication, not stated as uncontrolled 01/17/2013   Past Surgical History:  Procedure Laterality Date   CARDIAC CATHETERIZATION  2001   30% blockage   CHOLECYSTECTOMY  2003   LAP-BAND surgery  April 2007   ROBOTIC ASSISTED LAPAROSCOPIC HYSTERECTOMY AND SALPINGECTOMY  2014   TUBAL LIGATION  1989   Family History  Problem Relation Age of Onset   Pancreatic cancer Mother    Depression Mother    Throat cancer Brother    Heart attack Father 6   Hypertension Father    Stroke Father    Depression Daughter    Diabetes Unknown        Grandfather   Liver disease Sister        Fatty liver   Social History   Socioeconomic History   Marital status: Widowed    Spouse name: Penny Bauer   Number of children: 3   Years of education: Not on file   Highest education level: Not on file  Occupational History   Occupation: Therapist, sports    Comment: Dealer   Tobacco Use   Smoking status: Never   Smokeless tobacco: Never  Vaping Use   Vaping Use: Never used  Substance and Sexual Activity   Alcohol use: Yes    Comment: Rarely   Drug use: No   Sexual activity: Not on file  Other Topics Concern   Not on file  Social History Narrative   Her daughers are Charma Igo and [employer]. No regular exercise. Husband is disabled.   Social Determinants of Health   Financial Resource Strain: Low Risk    Difficulty of Paying Living Expenses: Not hard at all  Food Insecurity: No Food Insecurity   Worried About Charity fundraiser in the Last Year: Never true   [place] in the Last Year: Never true  Transportation Needs: No Transportation Needs   Lack of Transportation (Medical): No   Lack of Transportation (Non-Medical): No  Physical Activity: Insufficiently Active   Days of Exercise per Week: 1 day   Minutes of Exercise per Session: 30 min  Stress: No Stress Concern Present   Feeling of Stress :  Not at all  Social Connections: Moderately Isolated   Frequency of Communication with Friends and Family: More than three times a week   Frequency of Social Gatherings with Friends and Family: More than three times a week   Attends Religious Services: More than 4 times per year   Active Member of [employer] or Organizations: No   Attends Archivist Meetings: Never   Marital Status: Widowed    Tobacco Counseling Counseling given: Not Answered   Clinical Intake:  Pre-visit preparation completed: Yes  Pain : No/denies pain (minor arthritic pain)     BMI - recorded: 40.6 Nutritional Status: BMI > 30  Obese Nutritional Risks: None Diabetes: Yes CBG done?: No Did pt. bring  in CBG monitor from home?: No  How often do you need to have someone help you when you read instructions, pamphlets, or other written materials from your doctor or pharmacy?: 1 - Never  Diabetic?Nutrition Risk Assessment:  Has the patient had any N/V/D within the last 2 months?  No  Does the patient have any non-healing wounds?  No  Has the patient had any unintentional weight loss or weight gain?  No   Diabetes:  Is the patient diabetic?  Yes  If diabetic, was a CBG obtained today?  No  Did the patient bring in their glucometer from home?  No  How often do you monitor your CBG's? As needed    Financial Strains and Diabetes Management:  Are you having any financial strains with the device, your supplies or your medication? No .  Does the patient want to be seen by Chronic Care Management for management of their diabetes?  No  Would the patient like to be referred to a Nutritionist or for Diabetic Management?  No   Diabetic Exams:  Diabetic Eye Exam: Completed 06/23/20 Diabetic Foot Exam: Overdue, Pt has been advised about the importance in completing this exam. Pt is scheduled for diabetic foot exam on next appt .   Interpreter Needed?: No  Information entered by :: Charlott Rakes,  LPN   Activities of Daily Living In your present state of health, do you have any difficulty performing the following activities: 10/13/2020  Hearing? Y  Comment slight hearing loss  Vision? N  Difficulty concentrating or making decisions? N  Walking or climbing stairs? Y  Comment winded at times  Dressing or bathing? N  Doing errands, shopping? N  Preparing Food and eating ? N  Using the Toilet? N  In the past six months, have you accidently leaked urine? Y  Comment at times will wear a pad  Do you have problems with loss of bowel control? N  Managing your Medications? N  Managing your Finances? N  Housekeeping or managing your Housekeeping? N  Some recent data might be hidden    Patient Care Team: Hali Marry, MD as PCP - General Dr. Genia Del (Oncology) Jimmie Molly, OD (Optometry) Darius Bump, [facility] as Pharmacist (Pharmacist)  Indicate any recent Medical Services you may have received from other than [facility] providers in the past year (date may be approximate).     Assessment:   This is a routine wellness examination for [place].  Hearing/Vision screen Hearing Screening - Comments:: Pt stated slight Hearing loss Vision Screening - Comments:: Pt follow up with eye care was Dr Domingo Cocking for annual eye exams   Dietary issues and exercise activities discussed: Current Exercise Habits: Home exercise routine, Type of exercise: walking, Time (Minutes): 30, Frequency (Times/Week): 1, Weekly Exercise (Minutes/Week): 30   Goals Addressed             This Visit's Progress    Patient Stated       None at this time       Depression Screen PHQ 2/9 Scores 10/13/2020 05/15/2019 06/04/2018 02/05/2018 09/06/2017 03/07/2017 07/04/2016  PHQ - 2 Score 0 0 0 0 0 0 0    Fall Risk Fall Risk  10/13/2020 05/15/2019 09/06/2017 03/07/2017 07/04/2016  Falls in the past year? 0 0 No No No  Number falls in past yr: 0 0 - - -  Injury with Fall? 0 0 - - -  Risk for fall due to :  Impaired vision  No Fall Risks - - -  Follow up Falls prevention discussed - - - -    FALL RISK PREVENTION PERTAINING TO THE HOME:  Any stairs in or around the home? Yes  If so, are there any without handrails? No  Home free of loose throw rugs in walkways, pet beds, electrical cords, etc? Yes  Adequate lighting in your home to reduce risk of falls? Yes   ASSISTIVE DEVICES UTILIZED TO PREVENT FALLS:  Life alert? No  Use of a cane, walker or w/c? No  Grab bars in the bathroom? Yes  Shower chair or bench in shower? Yes  Elevated toilet seat or a handicapped toilet? No   TIMED UP AND GO:  Was the test performed? No .   Cognitive Function:     6CIT Screen 10/13/2020  What Year? 0 points  What month? 0 points  What time? 0 points  Count back from 20 0 points  Months in reverse 0 points  Repeat phrase 0 points  Total Score 0    Immunizations Immunization History  Administered Date(s) Administered   Fluad Quad(high Dose 65+) 10/14/2018, 11/03/2019   Influenza Whole 10/30/2005, 11/23/2008   Influenza, High Dose Seasonal PF 09/20/2016, 10/18/2017   Influenza,inj,Quad PF,6+ Mos 10/16/2013, 12/07/2014, 09/17/2015   Influenza-Unspecified 11/20/2012   PFIZER(Purple Top)SARS-COV-2 Vaccination 03/06/2019, 03/31/2019, 10/27/2019, 05/09/2020   Pneumococcal Conjugate-13 10/16/2013   Pneumococcal Polysaccharide-23 01/03/2016   Td 09/30/2005   Tdap 05/17/2010   Varicella Zoster Immune Globulin 08/04/2010    TDAP status: Due, Education has been provided regarding the importance of this vaccine. Advised may receive this vaccine at local pharmacy or Health Dept. Aware to provide a copy of the vaccination record if obtained from local pharmacy or Health Dept. Verbalized acceptance and understanding.  Flu Vaccine status: Due, Education has been provided regarding the importance of this vaccine. Advised may receive this vaccine at local pharmacy or Health Dept. Aware to provide a copy of  the vaccination record if obtained from local pharmacy or Health Dept. Verbalized acceptance and understanding.  Pneumococcal vaccine status: Up to date  Covid-19 vaccine status: Completed vaccines  Qualifies for Shingles Vaccine? Yes   Zostavax completed No   Shingrix Completed?: No.    Education has been provided regarding the importance of this vaccine. Patient has been advised to call insurance company to determine out of pocket expense if they have not yet received this vaccine. Advised may also receive vaccine at local pharmacy or Health Dept. Verbalized acceptance and understanding.  Screening Tests Health Maintenance  Topic Date Due   Zoster Vaccines- Shingrix (1 of 2) Never done   DEXA SCAN  Never done   FOOT EXAM  06/06/2018   TETANUS/TDAP  05/16/2020   INFLUENZA VACCINE  08/30/2020   HEMOGLOBIN A1C  09/05/2020   COVID-19 Vaccine (5 - Booster for Pfizer series) 09/08/2020   COLONOSCOPY (Pts 45-61yr Insurance coverage will need to be confirmed)  09/13/2020   URINE MICROALBUMIN  11/17/2020   MAMMOGRAM  06/16/2021   OPHTHALMOLOGY EXAM  06/23/2021   Hepatitis C Screening  Completed   PNA vac Low Risk Adult  Completed   HPV VACCINES  Aged Out    Health Maintenance  Health Maintenance Due  Topic Date Due   Zoster Vaccines- Shingrix (1 of 2) Never done   DEXA SCAN  Never done   FOOT EXAM  06/06/2018   TETANUS/TDAP  05/16/2020   INFLUENZA VACCINE  08/30/2020   HEMOGLOBIN A1C  09/05/2020  COVID-19 Vaccine (5 - Booster for Pfizer series) 09/08/2020   COLONOSCOPY (Pts 45-73yr Insurance coverage will need to be confirmed)  09/13/2020   URINE MICROALBUMIN  11/17/2020    Colorectal cancer screening: Referral to GI placed pt scheduled for 10/26/20 appt . Pt aware the office will call re: appt.  Mammogram status: Completed 06/17/19. Repeat every year  Bone scan was ordered 03/08/20   Additional Screening:  Hepatitis C Screening:  Completed 02/08/16  Vision Screening:  Recommended annual ophthalmology exams for early detection of glaucoma and other disorders of the eye. Is the patient up to date with their annual eye exam?  Yes  Who is the provider or what is the name of the office in which the patient attends annual eye exams? Was with Dr FTrena Platt If pt is not established with a provider, would they like to be referred to a provider to establish care? No .   Dental Screening: Recommended annual dental exams for proper oral hygiene  Community Resource Referral / Chronic Care Management: CRR required this visit?  No   CCM required this visit?  No      Plan:     I have personally reviewed and noted the following in the patient's chart:   Medical and social history Use of alcohol, tobacco or illicit drugs  Current medications and supplements including opioid prescriptions. Patient is not currently taking opioid prescriptions. Functional ability and status Nutritional status Physical activity Advanced directives List of other physicians Hospitalizations, surgeries, and ER visits in previous 12 months Vitals Screenings to include cognitive, depression, and falls Referrals and appointments  In addition, I have reviewed and discussed with patient certain preventive protocols, quality metrics, and best practice recommendations. A written personalized care plan for preventive services as well as general preventive health recommendations were provided to patient.     TWillette Brace LPN   [id number]  Nurse Notes: None

## 2020-10-13 NOTE — Patient Instructions (Signed)
Penny Bauer , Thank you for taking time to come for your Medicare Wellness Visit. I appreciate your ongoing commitment to your health goals. Please review the following plan we discussed and let me know if I can assist you in the future.   Screening recommendations/referrals: Colonoscopy: Scheduled 10/26/20  Mammogram: Done 06/17/19 repeat every year Bone Density: pt will discuss with provider  Recommended yearly ophthalmology/optometry visit for glaucoma screening and checkup Recommended yearly dental visit for hygiene and checkup  Vaccinations: Influenza vaccine: Due Pneumococcal vaccine: Completed  Tdap vaccine: Due Shingles vaccine: Shingrix discussed. Please contact your pharmacy for coverage information.    Covid-19:Completed 2/4 & 03/30/20  Advanced directives: Please bring a copy of your health care power of attorney and living will to the office at your convenience.  Conditions/risks identified: None at this time  Next appointment: Follow up in one year for your annual wellness visit    Preventive Care 72 Years and Older, Female Preventive care refers to lifestyle choices and visits with your health care provider that can promote health and wellness. What does preventive care include? A yearly physical exam. This is also called an annual well check. Dental exams once or twice a year. Routine eye exams. Ask your health care provider how often you should have your eyes checked. Personal lifestyle choices, including: Daily care of your teeth and gums. Regular physical activity. Eating a healthy diet. Avoiding tobacco and drug use. Limiting alcohol use. Practicing safe sex. Taking low-dose aspirin every day. Taking vitamin and mineral supplements as recommended by your health care provider. What happens during an annual well check? The services and screenings done by your health care provider during your annual well check will depend on your age, overall health, lifestyle risk  factors, and family history of disease. Counseling  Your health care provider may ask you questions about your: Alcohol use. Tobacco use. Drug use. Emotional well-being. Home and relationship well-being. Sexual activity. Eating habits. History of falls. Memory and ability to understand (cognition). Work and work Statistician. Reproductive health. Screening  You may have the following tests or measurements: Height, weight, and BMI. Blood pressure. Lipid and cholesterol levels. These may be checked every 5 years, or more frequently if you are over 68 years old. Skin check. Lung cancer screening. You may have this screening every year starting at age 19 if you have a 30-pack-year history of smoking and currently smoke or have quit within the past 15 years. Fecal occult blood test (FOBT) of the stool. You may have this test every year starting at age 72 Flexible sigmoidoscopy or colonoscopy. You may have a sigmoidoscopy every 5 years or a colonoscopy every 10 years starting at age 72. Hepatitis C blood test. Hepatitis B blood test. Sexually transmitted disease (STD) testing. Diabetes screening. This is done by checking your blood sugar (glucose) after you have not eaten for a while (fasting). You may have this done every 1-3 years. Bone density scan. This is done to screen for osteoporosis. You may have this done starting at age 72. Mammogram. This may be done every 1-2 years. Talk to your health care provider about how often you should have regular mammograms. Talk with your health care provider about your test results, treatment options, and if necessary, the need for more tests. Vaccines  Your health care provider may recommend certain vaccines, such as: Influenza vaccine. This is recommended every year. Tetanus, diphtheria, and acellular pertussis (Tdap, Td) vaccine. You may need a Td booster every  10 years. Zoster vaccine. You may need this after age 72. Pneumococcal 13-valent  conjugate (PCV13) vaccine. One dose is recommended after age 72. Pneumococcal polysaccharide (PPSV23) vaccine. One dose is recommended after age 72. Talk to your health care provider about which screenings and vaccines you need and how often you need them. This information is not intended to replace advice given to you by your health care provider. Make sure you discuss any questions you have with your health care provider. Document Released: 02/12/2015 Document Revised: 10/06/2015 Document Reviewed: 11/17/2014 Elsevier Interactive Patient Education  2017 [place] Prevention in the Home Falls can cause injuries. They can happen to people of all ages. There are many things you can do to make your home safe and to help prevent falls. What can I do on the outside of my home? Regularly fix the edges of walkways and driveways and fix any cracks. Remove anything that might make you trip as you walk through a door, such as a raised step or threshold. Trim any bushes or trees on the path to your home. Use bright outdoor lighting. Clear any walking paths of anything that might make someone trip, such as rocks or tools. Regularly check to see if handrails are loose or broken. Make sure that both sides of any steps have handrails. Any raised decks and porches should have guardrails on the edges. Have any leaves, snow, or ice cleared regularly. Use sand or salt on walking paths during winter. Clean up any spills in your garage right away. This includes oil or grease spills. What can I do in the bathroom? Use night lights. Install grab bars by the toilet and in the tub and shower. Do not use towel bars as grab bars. Use non-skid mats or decals in the tub or shower. If you need to sit down in the shower, use a plastic, non-slip stool. Keep the floor dry. Clean up any water that spills on the floor as soon as it happens. Remove soap buildup in the tub or shower regularly. Attach bath mats  securely with double-sided non-slip rug tape. Do not have throw rugs and other things on the floor that can make you trip. What can I do in the bedroom? Use night lights. Make sure that you have a light by your bed that is easy to reach. Do not use any sheets or blankets that are too big for your bed. They should not hang down onto the floor. Have a firm chair that has side arms. You can use this for support while you get dressed. Do not have throw rugs and other things on the floor that can make you trip. What can I do in the kitchen? Clean up any spills right away. Avoid walking on wet floors. Keep items that you use a lot in easy-to-reach places. If you need to reach something above you, use a strong step stool that has a grab bar. Keep electrical cords out of the way. Do not use floor polish or wax that makes floors slippery. If you must use wax, use non-skid floor wax. Do not have throw rugs and other things on the floor that can make you trip. What can I do with my stairs? Do not leave any items on the stairs. Make sure that there are handrails on both sides of the stairs and use them. Fix handrails that are broken or loose. Make sure that handrails are as long as the stairways. Check any carpeting to make  sure that it is firmly attached to the stairs. Fix any carpet that is loose or worn. Avoid having throw rugs at the top or bottom of the stairs. If you do have throw rugs, attach them to the floor with carpet tape. Make sure that you have a light switch at the top of the stairs and the bottom of the stairs. If you do not have them, ask someone to add them for you. What else can I do to help prevent falls? Wear shoes that: Do not have high heels. Have rubber bottoms. Are comfortable and fit you well. Are closed at the toe. Do not wear sandals. If you use a stepladder: Make sure that it is fully opened. Do not climb a closed stepladder. Make sure that both sides of the stepladder  are locked into place. Ask someone to hold it for you, if possible. Clearly mark and make sure that you can see: Any grab bars or handrails. First and last steps. Where the edge of each step is. Use tools that help you move around (mobility aids) if they are needed. These include: Canes. Walkers. Scooters. Crutches. Turn on the lights when you go into a dark area. Replace any light bulbs as soon as they burn out. Set up your furniture so you have a clear path. Avoid moving your furniture around. If any of your floors are uneven, fix them. If there are any pets around you, be aware of where they are. Review your medicines with your doctor. Some medicines can make you feel dizzy. This can increase your chance of falling. Ask your doctor what other things that you can do to help prevent falls. This information is not intended to replace advice given to you by your health care provider. Make sure you discuss any questions you have with your health care provider. Document Released: 11/12/2008 Document Revised: 06/24/2015 Document Reviewed: 02/20/2014 Elsevier Interactive Patient Education  2017 [employer].

## 2020-10-18 ENCOUNTER — Other Ambulatory Visit: Payer: Self-pay

## 2020-10-18 ENCOUNTER — Encounter: Payer: Self-pay | Admitting: Family Medicine

## 2020-10-18 ENCOUNTER — Ambulatory Visit (INDEPENDENT_AMBULATORY_CARE_PROVIDER_SITE_OTHER): Payer: Medicare HMO | Admitting: Family Medicine

## 2020-10-18 VITALS — BP 120/46 | HR 83 | Ht 67.0 in | Wt 238.0 lb

## 2020-10-18 DIAGNOSIS — M2550 Pain in unspecified joint: Secondary | ICD-10-CM

## 2020-10-18 DIAGNOSIS — Z23 Encounter for immunization: Secondary | ICD-10-CM

## 2020-10-18 DIAGNOSIS — E119 Type 2 diabetes mellitus without complications: Secondary | ICD-10-CM | POA: Diagnosis not present

## 2020-10-18 DIAGNOSIS — F4321 Adjustment disorder with depressed mood: Secondary | ICD-10-CM

## 2020-10-18 DIAGNOSIS — R0789 Other chest pain: Secondary | ICD-10-CM

## 2020-10-18 DIAGNOSIS — M791 Myalgia, unspecified site: Secondary | ICD-10-CM | POA: Diagnosis not present

## 2020-10-18 DIAGNOSIS — E032 Hypothyroidism due to medicaments and other exogenous substances: Secondary | ICD-10-CM | POA: Diagnosis not present

## 2020-10-18 LAB — POCT GLYCOSYLATED HEMOGLOBIN (HGB A1C): Hemoglobin A1C: 6 % — AB (ref 4.0–5.6)

## 2020-10-18 MED ORDER — OXYBUTYNIN CHLORIDE 5 MG PO TABS: 5.0000 mg | ORAL_TABLET | Freq: Two times a day (BID) | ORAL | 3 refills | Status: DC

## 2020-10-18 MED ORDER — CELECOXIB 200 MG PO CAPS: 200.0000 mg | ORAL_CAPSULE | Freq: Every day | ORAL | 1 refills | Status: DC

## 2020-10-18 MED ORDER — TETANUS-DIPHTH-ACELL PERTUSSIS 5-2-15.5 LF-MCG/0.5 IM SUSP: 0.5000 mL | Freq: Once | INTRAMUSCULAR | 0 refills | Status: AC

## 2020-10-18 MED ORDER — METFORMIN HCL ER 500 MG PO TB24: 1000.0000 mg | ORAL_TABLET | Freq: Every day | ORAL | 3 refills | Status: DC

## 2020-10-18 MED ORDER — LEVOTHYROXINE SODIUM 150 MCG PO TABS: 150.0000 ug | ORAL_TABLET | Freq: Every day | ORAL | 1 refills | Status: DC

## 2020-10-18 MED ORDER — HYDROCHLOROTHIAZIDE 25 MG PO TABS: 25.0000 mg | ORAL_TABLET | Freq: Every day | ORAL | 3 refills | Status: DC

## 2020-10-18 NOTE — Assessment & Plan Note (Signed)
Normal grieving over her husband who  Passed 6 months ago. Offered to refer her for counseling if she feel would be helpful. Takes her Yolanda Bonine to school each morning.

## 2020-10-18 NOTE — Assessment & Plan Note (Signed)
Due to recheck TSH. 

## 2020-10-18 NOTE — Progress Notes (Signed)
Established Patient Office Visit  Subjective:  Patient ID: Penny Bauer, female    DOB: 09-19-1950  Age: 70 y.o. MRN: [id number]  CC:  Chief Complaint  Patient presents with   Diabetes   Hypertension    HPI Penny Bauer presents for   Diabetes - no hypoglycemic events. No wounds or sores that are not healing well. No increased thirst or urination. Checking glucose at home. Taking medications as prescribed without any side effects.  Colonoscopy is scheduled for next week.  Hypothyroidism - Taking medication regularly in the AM away from food and vitamins, etc. No recent change to skin, hair, or energy levels.  She also reports some on and off chest pain sometimes on the left side.  She says she is just not sure if sometimes it is more musculoskeletal as she is left-handed and wonders if some of it may just be the muscles going into her chest.  She had a cardiac catheterization back in 2001 and says she was told she had a 30% blockage in one of the vessels at that time.  About 3 years ago she had a work-up for palpitations and had a heart monitor that she wore for about a month but it was pretty unrevealing.  She still gets palpitations sometimes.  She is not sleeping well she says is just really hard to get comfortable she has a lot of joint and muscle aches at night she has been taking ibuprofen 800 mg at bedtime and feels like it helps some but not like it used to she would like to try Celebrex.  She did try naproxen as well but felt like that did nothing.  She feels like she has been swelling a little bit more in her lower extremities but she is also been sitting and doing some crafts for her church [employer].  She says is gotten better in the last couple of days  Past Medical History:  Diagnosis Date   Adenocarcinoma ([place])    Controlled type 2 diabetes mellitus without complication, without long-term current use of insulin ([place]) 03/07/2017   DEFICIENCY, VITAMIN D NOS 01/18/2006    Qualifier: Diagnosis of  By: Madilyn Fireman MD, Ashraf Mesta     Endometrial cancer [facility])    Endometrial carcinoma ([place]) 09/06/2012   Endometrial biopsy performed on 08/27/2012. Final pathology revealed a patchy a grade 2 endometrial adenocarcinoma. She was referred to Dr. Genia Del at [place].    HYPERCHOLESTEROLEMIA 01/18/2006   Qualifier: Diagnosis of  By: Madilyn Fireman MD, Arleigh Dicola     Hyperlipidemia    Hypertension    Hypothyroid    Hypothyroidism 09/05/2012   Iatrogenic hypothyroidism 01/18/2006   Qualifier: Diagnosis of  By: Madilyn Fireman MD, Barnetta Chapel     Lower leg edema 08/04/2010   On hctz for this(not for blood pressure).     Palpitations 09/16/2009   Qualifier: Diagnosis of  By: Madilyn Fireman MD, Zettie Pho, HX OF 01/18/2006   Qualifier: Diagnosis of  By: Madilyn Fireman MD, Abbee Cremeens     Type II or unspecified type diabetes mellitus without mention of complication, not stated as uncontrolled 01/17/2013    Past Surgical History:  Procedure Laterality Date   CARDIAC CATHETERIZATION  2001   30% blockage   CHOLECYSTECTOMY  2003   LAP-BAND surgery  April 2007   ROBOTIC ASSISTED LAPAROSCOPIC HYSTERECTOMY AND SALPINGECTOMY  2014   TUBAL LIGATION  1989    Family History  Problem Relation Age of Onset   Pancreatic cancer  Mother    Depression Mother    Throat cancer Brother    Heart attack Father 87   Hypertension Father    Stroke Father    Depression Daughter    Diabetes Unknown        Grandfather   Liver disease Sister        Fatty liver    Social History   Socioeconomic History   Marital status: Widowed    Spouse name: Merry Proud   Number of children: 3   Years of education: Not on file   Highest education level: Not on file  Occupational History   Occupation: Therapist, sports    Comment: Dealer   Tobacco Use   Smoking status: Never   Smokeless tobacco: Never  Vaping Use   Vaping Use: Never used  Substance and Sexual Activity   Alcohol use: Yes    Comment:  Rarely   Drug use: No   Sexual activity: Not on file  Other Topics Concern   Not on file  Social History Narrative   Her daughers are Charma Igo and [employer]. No regular exercise. Husband is disabled.   Social Determinants of Health   Financial Resource Strain: Low Risk    Difficulty of Paying Living Expenses: Not hard at all  Food Insecurity: No Food Insecurity   Worried About Charity fundraiser in the Last Year: Never true   [place] in the Last Year: Never true  Transportation Needs: No Transportation Needs   Lack of Transportation (Medical): No   Lack of Transportation (Non-Medical): No  Physical Activity: Insufficiently Active   Days of Exercise per Week: 1 day   Minutes of Exercise per Session: 30 min  Stress: No Stress Concern Present   Feeling of Stress : Not at all  Social Connections: Moderately Isolated   Frequency of Communication with Friends and Family: More than three times a week   Frequency of Social Gatherings with Friends and Family: More than three times a week   Attends Religious Services: More than 4 times per year   Active Member of [employer] or Organizations: No   Attends Archivist Meetings: Never   Marital Status: Widowed  Human resources officer Violence: Not At Risk   Fear of Current or Ex-Partner: No   Emotionally Abused: No   Physically Abused: No   Sexually Abused: No    Outpatient Medications Prior to Visit  Medication Sig Dispense Refill   ACCU-CHEK FASTCLIX LANCETS MISC U TO TEST BLOOD SUGAR DAILY  0   ACCU-CHEK GUIDE test strip DM E11.9 Check fasting blood sugar daily and once 2 hours after largest meal of the day as needed. 100 each prn   aspirin 81 MG tablet Take 81 mg by mouth daily.     atorvastatin (LIPITOR) 20 MG tablet TAKE 1 AND 1/2 TABLETS EVERY DAY 135 tablet 3   blood glucose meter kit and supplies KIT Dispense based on patient and insurance preference. Use up to once a day as directed. (FOR ICD-9 250.00,  250.01). 1 each 0   ibuprofen (ADVIL) 800 MG tablet Take 800 mg by mouth every 8 (eight) hours as needed.     metoprolol succinate (TOPROL-XL) 100 MG 24 hr tablet TAKE 1 TABLET DAILY (TAKE WITH OR IMMEDIATELY FOLLOWING A MEAL) 90 tablet 0   PFIZER-BIONT COVID-19 VAC-TRIS SUSP injection      hydrochlorothiazide (HYDRODIURIL) 25 MG tablet TAKE 1 TABLET (25 MG TOTAL) BY MOUTH DAILY. 90 tablet  3   levothyroxine (SYNTHROID) 150 MCG tablet TAKE 1 TABLET (150 MCG TOTAL) BY MOUTH DAILY BEFORE BREAKFAST. 90 tablet 1   metFORMIN (GLUCOPHAGE-XR) 500 MG 24 hr tablet Take 2 tablets (1,000 mg total) by mouth daily with breakfast. 180 tablet 3   oxybutynin (DITROPAN) 5 MG tablet TAKE 1 TABLET TWICE DAILY 180 tablet 0   No facility-administered medications prior to visit.    Allergies  Allergen Reactions   Zocor [Simvastatin] Other (See Comments)    Leg cramping    ROS Review of Systems    Objective:    Physical Exam Constitutional:      Appearance: Normal appearance. She is well-developed.  HENT:     Head: Normocephalic and atraumatic.  Cardiovascular:     Rate and Rhythm: Normal rate and regular rhythm.     Heart sounds: Normal heart sounds.  Pulmonary:     Effort: Pulmonary effort is normal.     Breath sounds: Normal breath sounds.  Skin:    General: Skin is warm and dry.  Neurological:     Mental Status: She is alert and oriented to person, place, and time.  Psychiatric:        Behavior: Behavior normal.    BP (!) 120/46   Pulse 83   Ht _0  (1.702 m)   Wt 238 lb (108 kg)   SpO2 95%   BMI 37.28 kg/m  Wt Readings from Last 3 Encounters:  10/18/20 238 lb (108 kg)  03/08/20 231 lb (104.8 kg)  11/18/19 237 lb (107.5 kg)     Health Maintenance Due  Topic Date Due   Zoster Vaccines- Shingrix (1 of 2) Never done   DEXA SCAN  Never done   TETANUS/TDAP  05/16/2020   COVID-19 Vaccine (5 - Booster for Pfizer series) 09/08/2020   COLONOSCOPY (Pts 45-79yr Insurance coverage  will need to be confirmed)  09/13/2020   URINE MICROALBUMIN  11/17/2020    There are no preventive care reminders to display for this patient.  Lab Results  Component Value Date   TSH 1.87 03/05/2020   Lab Results  Component Value Date   WBC 6.5 03/05/2020   HGB 13.3 03/05/2020   HCT 40.3 03/05/2020   MCV 93.1 03/05/2020   PLT 250 03/05/2020   Lab Results  Component Value Date   NA 143 03/05/2020   K 4.2 03/05/2020   CO2 34 (H) 03/05/2020   GLUCOSE 114 (H) 03/05/2020   BUN 15 03/05/2020   CREATININE 0.64 03/05/2020   BILITOT 0.6 03/05/2020   ALKPHOS 84 02/08/2016   AST 11 03/05/2020   ALT 11 03/05/2020   PROT 6.7 03/05/2020   ALBUMIN 3.7 02/08/2016   CALCIUM 9.5 03/05/2020   Lab Results  Component Value Date   CHOL 142 03/05/2020   Lab Results  Component Value Date   HDL 46 (L) 03/05/2020   Lab Results  Component Value Date   LDLCALC 79 03/05/2020   Lab Results  Component Value Date   TRIG 90 03/05/2020   Lab Results  Component Value Date   CHOLHDL 3.1 03/05/2020   Lab Results  Component Value Date   HGBA1C 6.0 (A) 10/18/2020      Assessment & Plan:   Problem List Items Addressed This Visit       Endocrine   Iatrogenic hypothyroidism    Due to recheck TSH      Relevant Medications   levothyroxine (SYNTHROID) 150 MCG tablet   Controlled type 2 diabetes  mellitus without complication, without long-term current use of insulin ([place]) - Primary    Well controlled. Continue current regimen. Follow up in  4 mo       Relevant Medications   metFORMIN (GLUCOPHAGE-XR) 500 MG 24 hr tablet   Other Relevant Orders   POCT glycosylated hemoglobin (Hb A1C) (Completed)   BASIC METABOLIC PANEL WITH GFR     Other   Grief    Normal grieving over her husband who  Passed 6 months ago. Offered to refer her for counseling if she feel would be helpful. Takes her Yolanda Bonine to school each morning.        Other Visit Diagnoses     Need for immunization  against influenza       Relevant Orders   Flu Vaccine QUAD High Dose(Fluad) (Completed)   Myalgia       Relevant Medications   celecoxib (CELEBREX) 200 MG capsule   Other Relevant Orders   TSH   CK (Creatine Kinase)   Sedimentation rate   C-reactive protein   Polyarthralgia       Relevant Medications   celecoxib (CELEBREX) 200 MG capsule   Other Relevant Orders   TSH   CK (Creatine Kinase)   Sedimentation rate   C-reactive protein   Atypical chest pain       Relevant Orders   EKG 12-Lead      Atypical chest pain - EKG today shows rate of 77 bpm, normal sinus rhythm with no acute ST-T wave changes.  No prior change from May 2019. Consider cardiology referral.    Myalgias and polyarthralgias that are keeping her awake at night.  We we will try Celebrex and see if it is a little bit more helpful than ibuprofen.  Also encouraged her to work on doing some gentle stretching in the late afternoon or early evenings and trying to get back into walking her dogs I think this will help with sleep quality as well as may be even help with some of the aches and pains.  Meds ordered this encounter  Medications   Tdap (ADACEL) 05-31-13.5 LF-MCG/0.5 injection    Sig: Inject 0.5 mLs into the muscle once for 1 dose.    Dispense:  0.5 mL    Refill:  0   celecoxib (CELEBREX) 200 MG capsule    Sig: Take 1 capsule (200 mg total) by mouth daily.    Dispense:  90 capsule    Refill:  1   levothyroxine (SYNTHROID) 150 MCG tablet    Sig: Take 1 tablet (150 mcg total) by mouth daily before breakfast.    Dispense:  90 tablet    Refill:  1   metFORMIN (GLUCOPHAGE-XR) 500 MG 24 hr tablet    Sig: Take 2 tablets (1,000 mg total) by mouth daily with breakfast.    Dispense:  180 tablet    Refill:  3   oxybutynin (DITROPAN) 5 MG tablet    Sig: Take 1 tablet (5 mg total) by mouth 2 (two) times daily.    Dispense:  180 tablet    Refill:  3   hydrochlorothiazide (HYDRODIURIL) 25 MG tablet    Sig: Take 1  tablet (25 mg total) by mouth daily.    Dispense:  90 tablet    Refill:  3    Follow-up: Return in about 6 months (around 04/17/2021).    [name], MD

## 2020-10-18 NOTE — Assessment & Plan Note (Signed)
Well controlled. Continue current regimen. Follow up in  4 mo 

## 2020-10-19 LAB — TSH: TSH: 0.13 mIU/L — ABNORMAL LOW (ref 0.40–4.50)

## 2020-10-19 LAB — BASIC METABOLIC PANEL WITH GFR
BUN: 20 mg/dL (ref 7–25)
CO2: 33 mmol/L — ABNORMAL HIGH (ref 20–32)
Calcium: 10.1 mg/dL (ref 8.6–10.4)
Chloride: 101 mmol/L (ref 98–110)
Creat: 0.7 mg/dL (ref 0.60–1.00)
Glucose, Bld: 119 mg/dL — ABNORMAL HIGH (ref 65–99)
Potassium: 3.9 mmol/L (ref 3.5–5.3)
Sodium: 140 mmol/L (ref 135–146)
eGFR: 92 mL/min/{1.73_m2} (ref >=60)

## 2020-10-19 LAB — CK: Total CK: 49 U/L (ref 29–143)

## 2020-10-19 LAB — SEDIMENTATION RATE: Sed Rate: 38 mm/h — ABNORMAL HIGH (ref 0–30)

## 2020-10-19 LAB — C-REACTIVE PROTEIN: CRP: 2.4 mg/L (ref <8.0)

## 2020-10-19 MED ORDER — METOPROLOL SUCCINATE ER 100 MG PO TB24: 100.0000 mg | ORAL_TABLET | Freq: Every day | ORAL | 1 refills | Status: DC

## 2020-10-20 ENCOUNTER — Encounter: Payer: Self-pay | Admitting: Family Medicine

## 2020-10-20 DIAGNOSIS — E032 Hypothyroidism due to medicaments and other exogenous substances: Secondary | ICD-10-CM

## 2020-10-20 NOTE — Progress Notes (Signed)
Hi Penny Bauer your thyroid number has shifted.  It looks like you may be overmedicated. I would like to recheck this in 2-3 weeks if you don't mind before we make a change as you have been really stable for awhile. I want to make sure it is not a lab error. Your muscle enzyme level is normal. No excess breakdown of muscle tissue. You do have some mild inflammation but nothing that we would typically see in the autoimmune category.

## 2020-10-26 DIAGNOSIS — Z86010 Personal history of colonic polyps: Secondary | ICD-10-CM | POA: Diagnosis not present

## 2020-10-26 DIAGNOSIS — K621 Rectal polyp: Secondary | ICD-10-CM | POA: Diagnosis not present

## 2020-10-26 DIAGNOSIS — Z1211 Encounter for screening for malignant neoplasm of colon: Secondary | ICD-10-CM | POA: Diagnosis not present

## 2020-10-26 LAB — HM COLONOSCOPY

## 2021-04-06 ENCOUNTER — Encounter: Payer: Self-pay | Admitting: Family Medicine

## 2021-04-06 DIAGNOSIS — E032 Hypothyroidism due to medicaments and other exogenous substances: Secondary | ICD-10-CM

## 2021-04-14 DIAGNOSIS — E032 Hypothyroidism due to medicaments and other exogenous substances: Secondary | ICD-10-CM | POA: Diagnosis not present

## 2021-04-15 LAB — TSH: TSH: 2.04 mIU/L (ref 0.40–4.50)

## 2021-04-15 NOTE — Progress Notes (Signed)
Your lab work is within acceptable range and there are no concerning findings.   ?

## 2021-04-18 ENCOUNTER — Ambulatory Visit: Payer: Medicare HMO | Admitting: Family Medicine

## 2021-04-26 ENCOUNTER — Other Ambulatory Visit: Payer: Self-pay | Admitting: Family Medicine

## 2021-04-26 DIAGNOSIS — M2550 Pain in unspecified joint: Secondary | ICD-10-CM

## 2021-04-26 DIAGNOSIS — M791 Myalgia, unspecified site: Secondary | ICD-10-CM

## 2021-05-17 ENCOUNTER — Ambulatory Visit (INDEPENDENT_AMBULATORY_CARE_PROVIDER_SITE_OTHER): Payer: Medicare HMO | Admitting: Family Medicine

## 2021-05-17 ENCOUNTER — Ambulatory Visit (INDEPENDENT_AMBULATORY_CARE_PROVIDER_SITE_OTHER): Payer: Medicare HMO

## 2021-05-17 ENCOUNTER — Encounter: Payer: Self-pay | Admitting: Family Medicine

## 2021-05-17 VITALS — BP 120/50 | HR 65 | Wt 243.0 lb

## 2021-05-17 DIAGNOSIS — Z23 Encounter for immunization: Secondary | ICD-10-CM

## 2021-05-17 DIAGNOSIS — M4316 Spondylolisthesis, lumbar region: Secondary | ICD-10-CM | POA: Diagnosis not present

## 2021-05-17 DIAGNOSIS — F4321 Adjustment disorder with depressed mood: Secondary | ICD-10-CM

## 2021-05-17 DIAGNOSIS — G8929 Other chronic pain: Secondary | ICD-10-CM

## 2021-05-17 DIAGNOSIS — E119 Type 2 diabetes mellitus without complications: Secondary | ICD-10-CM

## 2021-05-17 DIAGNOSIS — M545 Low back pain, unspecified: Secondary | ICD-10-CM

## 2021-05-17 DIAGNOSIS — Z780 Asymptomatic menopausal state: Secondary | ICD-10-CM

## 2021-05-17 DIAGNOSIS — G4733 Obstructive sleep apnea (adult) (pediatric): Secondary | ICD-10-CM | POA: Diagnosis not present

## 2021-05-17 LAB — POCT UA - MICROALBUMIN
Albumin/Creatinine Ratio, Urine, POC: <30
Creatinine, POC: 50 mg/dL
Microalbumin Ur, POC: 10 mg/L

## 2021-05-17 LAB — POCT GLYCOSYLATED HEMOGLOBIN (HGB A1C): Hemoglobin A1C: 6 % — AB (ref 4.0–5.6)

## 2021-05-17 NOTE — Assessment & Plan Note (Signed)
Feels like overall she is getting there and making some progress.  PHQ-9 score of 12 and GAD-7 score of 2.  She still feels like she struggles a little bit with anxiety.  We discussed the possibility of therapy/counseling and/or medication such as Cymbalta which could also help with her back.  Just encouraged her to consider it. ?

## 2021-05-17 NOTE — Assessment & Plan Note (Signed)
We discussed options.  She would like to move forward with CPAP.  We can resubmit the sleep study and see if we can start with CPAP.  Her AHI was 12.3 and her study from 2019.  Can follow-up in 6 to 8 weeks after she starts CPAP to make adjustments as needed. ?

## 2021-05-17 NOTE — Assessment & Plan Note (Signed)
a1c 6.0 today. Looks great. She plans on continuing to work on her diet and exercise.  F/U in 3-4 mo  ?

## 2021-05-17 NOTE — Progress Notes (Signed)
? ?Established Patient Office Visit ? ?Subjective   ?Patient ID: Penny Bauer, female    DOB: 1950-10-24  Age: 71 y.o. MRN: [id number] ? ?Chief Complaint  ?Patient presents with  ? Diabetes  ? ? ?HPI ? ?Diabetes - no hypoglycemic events. No wounds or sores that are not healing well. No increased thirst or urination. Checking glucose at home. Taking medications as prescribed without any side effects. ? ?Hypothyroidism - Taking medication regularly in the AM away from food and vitamins, etc. No recent change to skin, hair, or energy levels.  Last TSH in March was 2.8 which was perfect.  Plan to recheck again in 6 months. ? ?Also reports that her low back has been getting gradually a little worse.  She says that by the end of the day she is bent forward because of pain and spasm.  She usually takes her Celebrex in the morning and then takes 800 mg ibuprofen at night.  She is not currently exercising.  She just feels tired during the day also because she is not sleeping well. ? ?She still not sleeping well and snoring.  She would like to get an updated sleep study.  She tried an oral appliance before COVID and try to stick with it but really did not like it and then she ended up having some dental work and then it did not fit.  So she really has not been doing anything since then. ? ? ? ?ROS ? ?  ?Objective:  ?  ? ?BP (!) 120/50   Pulse 65   Wt 243 lb (110.2 kg)   SpO2 94%   BMI 38.06 kg/m?  ? ? ?Physical Exam ?Vitals and nursing note reviewed.  ?Constitutional:   ?   Appearance: She is well-developed.  ?HENT:  ?   Head: Normocephalic and atraumatic.  ?Cardiovascular:  ?   Rate and Rhythm: Normal rate and regular rhythm.  ?   Heart sounds: Normal heart sounds.  ?Pulmonary:  ?   Effort: Pulmonary effort is normal.  ?   Breath sounds: Normal breath sounds.  ?Musculoskeletal:  ?   Comments: Tender over the right SI joint.  No significant tenderness over the lumbar spine directly.  ?Skin: ?   General: Skin is warm and  dry.  ?Neurological:  ?   Mental Status: She is alert and oriented to person, place, and time.  ?Psychiatric:     ?   Behavior: Behavior normal.  ? ? ? ?Results for orders placed or performed in visit on 05/17/21  ?POCT glycosylated hemoglobin (Hb A1C)  ?Result Value Ref Range  ? Hemoglobin A1C 6.0 (A) 4.0 - 5.6 %  ? HbA1c POC (<> result, manual entry)    ? HbA1c, POC (prediabetic range)    ? HbA1c, POC (controlled diabetic range)    ?POCT UA - Microalbumin  ?Result Value Ref Range  ? Microalbumin Ur, POC 10 mg/L  ? Creatinine, POC 50 mg/dL  ? Albumin/Creatinine Ratio, Urine, POC <30   ? ? ? ? ?The 10-year ASCVD risk score (Arnett DK, et al., 2019) is: 26.3% ? ?  ?Assessment & Plan:  ? ?Problem List Items Addressed This Visit   ? ?  ? Respiratory  ? OSA (obstructive sleep apnea)  ?  We discussed options.  She would like to move forward with CPAP.  We can resubmit the sleep study and see if we can start with CPAP.  Her AHI was 12.3 and her study from 2019.  Can follow-up in 6 to 8 weeks after she starts CPAP to make adjustments as needed. ? ?  ?  ?  ? Endocrine  ? Controlled type 2 diabetes mellitus without complication, without long-term current use of insulin ([facility]) - Primary  ?  a1c 6.0 today. Looks great. She plans on continuing to work on her diet and exercise.  F/U in 3-4 mo  ? ?  ?  ? Relevant Orders  ? POCT glycosylated hemoglobin (Hb A1C) (Completed)  ? POCT UA - Microalbumin (Completed)  ?  ? Other  ? Grief  ?  Feels like overall she is getting there and making some progress.  PHQ-9 score of 12 and GAD-7 score of 2.  She still feels like she struggles a little bit with anxiety.  We discussed the possibility of therapy/counseling and/or medication such as Cymbalta which could also help with her back.  Just encouraged her to consider it. ? ?  ?  ? Chronic bilateral low back pain without sciatica  ?  Handout with stretches to do on her own at home just encouraged her to commit 20-minute to the exercises daily  for the next couple of weeks to see if her back is improving we also discussed maybe even a trial of Cymbalta which I think could be helpful for her mood as well as her back she will consider it. ? ?  ?  ? Relevant Orders  ? DG Lumbar Spine Complete  ? ?Other Visit Diagnoses   ? ? Need for pneumococcal 20-valent conjugate vaccination      ? Relevant Orders  ? Pneumococcal conjugate vaccine 20-valent (Prevnar 20) (Completed)  ? Post-menopausal      ? Relevant Orders  ? DG Bone Density  ? ?  ? ? ?Encouraged her to schedule her Tdap and shingles at the pharmacy.   ? ?Return in about 2 months (around 07/17/2021) for New start CPAP.  ? ? ?[name], MD ? ?

## 2021-05-17 NOTE — Assessment & Plan Note (Signed)
Handout with stretches to do on her own at home just encouraged her to commit 20-minute to the exercises daily for the next couple of weeks to see if her back is improving we also discussed maybe even a trial of Cymbalta which I think could be helpful for her mood as well as her back she will consider it. ?

## 2021-05-19 ENCOUNTER — Other Ambulatory Visit: Payer: Self-pay | Admitting: *Deleted

## 2021-05-19 DIAGNOSIS — G8929 Other chronic pain: Secondary | ICD-10-CM

## 2021-05-19 DIAGNOSIS — E119 Type 2 diabetes mellitus without complications: Secondary | ICD-10-CM

## 2021-05-19 NOTE — Progress Notes (Signed)
Hi Penny Bauer the radiologist compared your x-ray from 1 that was done back in 2019 so about 4 years ago.  That did show some change since then.  In fact you have a little movement of 1 vertebrae shifting off of the other vertebrae by about 4 mm at L4-5 that was not there before.  You have some disc space narrowing which is consistent with like a disc herniation at L1-L3 and a little bit at L4-5 as well.  As well as just some mild to moderate arthritis particularly at L2-3.  There is also some arthritis at the hinge spots in the spine particularly at L3 S1 which is in the very very low back toward the buttock crease area.  That specific area has worsened as well.  I think the first step would be physical therapy for the anterolisthesis which is where the disc is shifted slightly off of the one below it.  It can be very helpful.  And if that is not improving then we can always get you in with one of our sports med docs or orthopedist to discuss next steps.  Let me know if you are okay with referral and I will be happy to place.

## 2021-05-25 ENCOUNTER — Ambulatory Visit (INDEPENDENT_AMBULATORY_CARE_PROVIDER_SITE_OTHER): Payer: Medicare HMO

## 2021-05-25 DIAGNOSIS — Z780 Asymptomatic menopausal state: Secondary | ICD-10-CM

## 2021-05-25 NOTE — Progress Notes (Signed)
HI Penny Bauer, your bone density test showed a T score of 0.5 which is actually considered normal.  Just continue with adequate calcium and vitamin D intake and weightbearing exercise plan to repeat test in about 5 years.

## 2021-06-01 DIAGNOSIS — H18593 Other hereditary corneal dystrophies, bilateral: Secondary | ICD-10-CM | POA: Diagnosis not present

## 2021-06-01 DIAGNOSIS — H18513 Endothelial corneal dystrophy, bilateral: Secondary | ICD-10-CM | POA: Diagnosis not present

## 2021-06-01 DIAGNOSIS — H2513 Age-related nuclear cataract, bilateral: Secondary | ICD-10-CM | POA: Diagnosis not present

## 2021-06-02 ENCOUNTER — Ambulatory Visit: Payer: Medicare HMO | Attending: Family Medicine | Admitting: Physical Therapy

## 2021-06-02 DIAGNOSIS — M545 Low back pain, unspecified: Secondary | ICD-10-CM | POA: Diagnosis not present

## 2021-06-02 DIAGNOSIS — M5459 Other low back pain: Secondary | ICD-10-CM | POA: Diagnosis not present

## 2021-06-02 DIAGNOSIS — G8929 Other chronic pain: Secondary | ICD-10-CM | POA: Diagnosis not present

## 2021-06-02 DIAGNOSIS — R262 Difficulty in walking, not elsewhere classified: Secondary | ICD-10-CM

## 2021-06-02 DIAGNOSIS — M6281 Muscle weakness (generalized): Secondary | ICD-10-CM | POA: Diagnosis not present

## 2021-06-02 DIAGNOSIS — R2689 Other abnormalities of gait and mobility: Secondary | ICD-10-CM | POA: Diagnosis not present

## 2021-06-02 NOTE — Therapy (Signed)
[place] ?[facility] ?[address] ?[place], [place], [address] ?Phone: [phone]   Fax:  [phone] ? ?Physical Therapy Evaluation ? ?Patient Details  ?Name: Penny Bauer ?MRN: [id number] ?Date of Birth: October 28, 1950 ?Referring Provider (PT): Hali Marry, MD ? ? ?Encounter Date: 06/02/2021 ? ? PT End of Session - 06/02/21 1612   ? ? Visit Number 1   ? Number of Visits 12   ? Date for PT Re-Evaluation 07/14/21   ? Authorization Type [employer]   ? PT Start Time 1535   ? PT Stop Time 1615   ? PT Time Calculation (min) 40 min   ? Activity Tolerance Patient tolerated treatment well   ? Behavior During Therapy [facility] for tasks assessed/performed   ? ?  ?  ? ?  ? ? ?Past Medical History:  ?Diagnosis Date  ? Adenocarcinoma ([place])   ? Controlled type 2 diabetes mellitus without complication, without long-term current use of insulin ([place]) 03/07/2017  ? DEFICIENCY, VITAMIN D NOS 01/18/2006  ? Qualifier: Diagnosis of  By: Madilyn Fireman MD, Barnetta Chapel    ? Endometrial cancer ([place])   ? Endometrial carcinoma ([place]) 09/06/2012  ? Endometrial biopsy performed on 08/27/2012. Final pathology revealed a patchy a grade 2 endometrial adenocarcinoma. She was referred to Dr. Genia Del at [place].   ? HYPERCHOLESTEROLEMIA 01/18/2006  ? Qualifier: Diagnosis of  By: Madilyn Fireman MD, Barnetta Chapel    ? Hyperlipidemia   ? Hypertension   ? Hypothyroid   ? Hypothyroidism 09/05/2012  ? Iatrogenic hypothyroidism 01/18/2006  ? Qualifier: Diagnosis of  By: Madilyn Fireman MD, Barnetta Chapel    ? Lower leg edema 08/04/2010  ? On hctz for this(not for blood pressure).    ? Palpitations 09/16/2009  ? Qualifier: Diagnosis of  By: Madilyn Fireman MD, Barnetta Chapel    ? TACHYCARDIA, HX OF 01/18/2006  ? Qualifier: Diagnosis of  By: Madilyn Fireman MD, Barnetta Chapel    ? Type II or unspecified type diabetes mellitus without mention of complication, not stated as uncontrolled 01/17/2013  ? ? ?Past Surgical History:  ?Procedure Laterality  Date  ? CARDIAC CATHETERIZATION  2001  ? 30% blockage  ? CHOLECYSTECTOMY  2003  ? LAP-BAND surgery  April 2007  ? ROBOTIC ASSISTED LAPAROSCOPIC HYSTERECTOMY AND SALPINGECTOMY  2014  ? TUBAL LIGATION  1989  ? ? ?There were no vitals filed for this visit. ? ? ? Subjective Assessment - 06/02/21 1535   ? ? Subjective Pt reports a bad back for many years. Pt notes worsening recently within the last 9 months. States she starts upright when she gets out of bed but by the end of the day she ends up more bent forward. Pt did have x-ray done last week. Ibuprofen used to work but now it no longer touches it.   ? Limitations Sitting;Standing;Walking   ? How long can you sit comfortably? No issues   ? How long can you stand comfortably? <5 min   ? How long can you walk comfortably? Walks when have to but does not do extra. Able to walk in grocery store if leaning on cart   ? Patient Stated Goals Walk and stand longer without having to look for a chair -- walk dog again   ? Currently in Pain? Yes   ? Pain Score 2    at worst 8/10 (usually extended standing or end of day)  ? Pain Location Back   ? Pain Orientation Mid;Lower   ? Pain Descriptors / Indicators Tightness;Squeezing   ?  Pain Type Chronic pain   ? Pain Onset More than a month ago   ? Pain Frequency Constant   ? Aggravating Factors  Upright activity   ? Pain Relieving Factors Heat, ibuprofen   ? ?  ?  ? ?  ? ? ? ? ? [facility] PT Assessment - 06/02/21 0001   ? ?  ? Assessment  ? Medical Diagnosis M54.50,G89.29 (ICD-10-CM) - Chronic bilateral low back pain without sciatica   ? Referring Provider (PT) Hali Marry, MD   ? Hand Dominance Left   ? Prior Therapy PT briefly for her back   ?  ? Precautions  ? Precautions None   ?  ? Balance Screen  ? Has the patient fallen in the past 6 months No   small LOB attaching new headboard  ?  ? Home Environment  ? Living Environment Private residence   ? Living Arrangements Alone   ? Available Help at Discharge Family   ? Type of  Home --   town home duplex  ? Home Access Level entry   ? Home Layout One level   ? Home Equipment Shower seat;Grab bars - tub/shower   ?  ? Prior Function  ? Vocation Retired   ? Leisure Dogs   ?  ? Observation/Other Assessments  ? Focus on Therapeutic Outcomes (FOTO)  42; predicted 51 at visit 12   ?  ? ROM / Strength  ? AROM / PROM / Strength Strength;AROM   ?  ? AROM  ? AROM Assessment Site Lumbar   ? Lumbar Flexion WFL   ? Lumbar Extension WFL   ? Lumbar - Right Side Bend ~1" above knee joint   ? Lumbar - Left Side Bend to knee joint   ? Lumbar - Right Rotation WFL   ? Lumbar - Left Rotation WFL   ?  ? Strength  ? Strength Assessment Site Hip;Knee   ? Right/Left Hip Right;Left   ? Right Hip Flexion 4/5   ? Right Hip Extension 3/5   ? Right Hip External Rotation  3/5   ? Right Hip Internal Rotation 4/5   ? Right Hip ABduction 3-/5   ? Left Hip Flexion 4+/5   ? Left Hip Extension 3/5   ? Left Hip External Rotation 3/5   ? Left Hip Internal Rotation 4/5   ? Left Hip ABduction 3+/5   ? Right/Left Knee Right;Left   ? Right Knee Flexion 3+/5   ? Right Knee Extension 4+/5   ? Left Knee Flexion 4-/5   ? Left Knee Extension 4+/5   ?  ? Palpation  ? Palpation comment no TTP noted   ?  ? Special Tests  ?  Special Tests Lumbar   ? Lumbar Tests FABER test;Prone Knee Bend Test;Straight Leg Raise   ?  ? FABER test  ? findings Positive   ?  ? Prone Knee Bend Test  ? Findings Negative   ?  ? Straight Leg Raise  ? Findings Negative   ?  ? Ambulation/Gait  ? Ambulation Distance (Feet) 100 Feet   ? Assistive device None   ? Gait Pattern Step-through pattern;Lateral trunk lean to right;Lateral trunk lean to left;Lateral hip instability;Abducted - left;Abducted- right   ? Ambulation Surface Level;Indoor   ? ?  ?  ? ?  ? ? ? ? ? ? ? ? ? ? ? ? ? ?Objective measurements completed on examination: See above findings.  ? ? ? ? ? ? ? ? ? ? ? ? ? ?  PT Education - 06/02/21 1612   ? ? Education Details Discussed exam findings, POC, and  initial HEP   ? Person(s) Educated Patient   ? Methods Explanation;Demonstration;Tactile cues;Verbal cues;Handout   ? Comprehension Verbalized understanding;Returned demonstration;Verbal cues required;Tactile cues required   ? ?  ?  ? ?  ? ? ? ? ? ? PT Long Term Goals - 06/02/21 1715   ? ?  ? PT LONG TERM GOAL #1  ? Title Pt will be independent with HEP   ? Time 6   ? Period Weeks   ? Status New   ? Target Date 07/14/21   ?  ? PT LONG TERM GOAL #2  ? Title Pt will be able to stand and walk at least 10 minutes without feeling the need to sit   ? Time 6   ? Period Weeks   ? Status New   ? Target Date 07/14/21   ?  ? PT LONG TERM GOAL #3  ? Title Pt will report >/=50% improvement in her pain   ? Time 6   ? Period Weeks   ? Status New   ? Target Date 07/14/21   ?  ? PT LONG TERM GOAL #4  ? Title Pt will have improved FOTO score to 51   ? Time 6   ? Period Weeks   ? Status New   ? Target Date 07/14/21   ?  ? PT LONG TERM GOAL #5  ? Title Pt will demo at least 4+/5 strength in bilat hip abd and ext for improved lumbosacral posture by end of day   ? Time 6   ? Period Weeks   ? Status New   ? Target Date 07/14/21   ? ?  ?  ? ?  ? ? ? ? ? ? ? ? ? Plan - 06/02/21 1612   ? ? Clinical Impression Statement Ms. Carleta Woodrow is a 71 y/o F presenting to OPPT due to increasing bilat LBP. Pt identifies pain mostly along sacral area. Assessment significant for posterior LE muscle weakness vs anterior musculature (i.e. glutes, hamstrings and calves are significantly weaker than hip flexors, quad, and ant tib) likely contributing to increased forward flexion as her day goes on. Pt would highly benefit from PT to address this muscle imbalance for improved standing and walking tolerance.   ? Personal Factors and Comorbidities Age;Time since onset of injury/illness/exacerbation;Past/Current Experience   ? Examination-Activity Limitations Locomotion Level;Stand   ? Examination-Participation Restrictions Cleaning;Community Activity;[employer] Work    ? Stability/Clinical Decision Making Stable/Uncomplicated   ? Clinical Decision Making Low   ? Rehab Potential Good   ? PT Frequency 2x / week   ? PT Duration 6 weeks   ? PT Treatment/Interventions ADLs/Self

## 2021-06-02 NOTE — Patient Instructions (Signed)
Access Code: [email or web address] ?URL: [email or web address] ?Date: 06/02/2021 ?Prepared by: Estill Bamberg April Thurnell Garbe ? ?Exercises ?- Hip Extension with Resistance Loop  - 1 x daily - 7 x weekly - 2 sets - 10 reps ?- Hip Abduction with Resistance Loop  - 1 x daily - 7 x weekly - 2 sets - 10 reps ?- Standing Hamstring Curl with Resistance  - 1 x daily - 7 x weekly - 2 sets - 10 reps ?- Standing Heel Raise  - 1 x daily - 7 x weekly - 2 sets - 10 reps ?

## 2021-06-06 ENCOUNTER — Encounter: Payer: Self-pay | Admitting: Family Medicine

## 2021-06-06 DIAGNOSIS — G4733 Obstructive sleep apnea (adult) (pediatric): Secondary | ICD-10-CM

## 2021-06-06 MED ORDER — DULOXETINE HCL 30 MG PO CPEP: 30.0000 mg | ORAL_CAPSULE | Freq: Every day | ORAL | 0 refills | Status: DC

## 2021-06-06 MED ORDER — AMBULATORY NON FORMULARY MEDICATION: 0 refills | Status: DC

## 2021-06-06 NOTE — Progress Notes (Unsigned)
Put in new order for CPAP and sent Cymbalta.  ?

## 2021-06-07 MED ORDER — AMBULATORY NON FORMULARY MEDICATION: 0 refills | Status: DC

## 2021-06-10 ENCOUNTER — Other Ambulatory Visit: Payer: Self-pay | Admitting: Family Medicine

## 2021-06-10 DIAGNOSIS — E032 Hypothyroidism due to medicaments and other exogenous substances: Secondary | ICD-10-CM

## 2021-06-13 ENCOUNTER — Ambulatory Visit: Payer: Medicare HMO | Admitting: Rehabilitative and Restorative Service Providers"

## 2021-06-13 ENCOUNTER — Encounter: Payer: Self-pay | Admitting: Rehabilitative and Restorative Service Providers"

## 2021-06-13 DIAGNOSIS — R2689 Other abnormalities of gait and mobility: Secondary | ICD-10-CM | POA: Diagnosis not present

## 2021-06-13 DIAGNOSIS — M6281 Muscle weakness (generalized): Secondary | ICD-10-CM

## 2021-06-13 DIAGNOSIS — M5459 Other low back pain: Secondary | ICD-10-CM

## 2021-06-13 DIAGNOSIS — R262 Difficulty in walking, not elsewhere classified: Secondary | ICD-10-CM | POA: Diagnosis not present

## 2021-06-13 DIAGNOSIS — G8929 Other chronic pain: Secondary | ICD-10-CM | POA: Diagnosis not present

## 2021-06-13 DIAGNOSIS — M545 Low back pain, unspecified: Secondary | ICD-10-CM | POA: Diagnosis not present

## 2021-06-13 NOTE — Patient Instructions (Signed)
Access Code: [email or web address] ?URL: [email or web address] ?Date: 06/13/2021 ?Prepared by: Gillermo Murdoch ? ?Exercises ?- Hip Extension with Resistance Loop  - 1 x daily - 7 x weekly - 2 sets - 10 reps ?- Hip Abduction with Resistance Loop  - 1 x daily - 7 x weekly - 2 sets - 10 reps ?- Standing Hamstring Curl with Resistance  - 1 x daily - 7 x weekly - 2 sets - 10 reps ?- Standing Heel Raise  - 1 x daily - 7 x weekly - 2 sets - 10 reps ?- Seated Hip Flexor Stretch  - 2 x daily - 7 x weekly - 1 sets - 3 reps - 30 sec  hold ?- Standing Lumbar Extension  - 2 x daily - 7 x weekly - 1 sets - 2-3 reps - 2-3 sec  hold ?- Anti-Rotation Lateral Stepping with Press  - 1 x daily - 7 x weekly - 1-2 sets - 10 reps - 2-3 sec  hold ?- Standing Bilateral Low Shoulder Row with Anchored Resistance  - 1 x daily - 7 x weekly - 1-3 sets - 10 reps - 2-3 sec  hold ?- Shoulder Extension with Resistance  - 1 x daily - 7 x weekly - 1-3 sets - 10 reps - 2-3 sec  hold ?- Sit to Stand  - 1 x daily - 7 x weekly - 1 sets - 10 reps - 3-5 sec  hold ?

## 2021-06-13 NOTE — Therapy (Signed)
[place] ?[facility] ?[address] ?[place], [place], [address] ?Phone: [phone]   Fax:  [phone] ? ?Physical Therapy Treatment ? ?Patient Details  ?Name: Penny Bauer ?MRN: [id number] ?Date of Birth: 10-18-1950 ?Referring Provider (PT): Hali Marry, MD ? ? ?Encounter Date: 06/13/2021 ? ? PT End of Session - 06/13/21 1107   ? ? Visit Number 2   ? Number of Visits 12   ? Date for PT Re-Evaluation 07/14/21   ? Authorization Type [employer]   ? PT Start Time 1103   ? PT Stop Time 2703   ? PT Time Calculation (min) 49 min   ? Activity Tolerance Patient tolerated treatment well   ? ?  ?  ? ?  ? ? ?Past Medical History:  ?Diagnosis Date  ? Adenocarcinoma ([place])   ? Controlled type 2 diabetes mellitus without complication, without long-term current use of insulin ([place]) 03/07/2017  ? DEFICIENCY, VITAMIN D NOS 01/18/2006  ? Qualifier: Diagnosis of  By: Madilyn Fireman MD, Barnetta Chapel    ? Endometrial cancer ([place])   ? Endometrial carcinoma ([place]) 09/06/2012  ? Endometrial biopsy performed on 08/27/2012. Final pathology revealed a patchy a grade 2 endometrial adenocarcinoma. She was referred to Dr. Genia Del at [place].   ? HYPERCHOLESTEROLEMIA 01/18/2006  ? Qualifier: Diagnosis of  By: Madilyn Fireman MD, Barnetta Chapel    ? Hyperlipidemia   ? Hypertension   ? Hypothyroid   ? Hypothyroidism 09/05/2012  ? Iatrogenic hypothyroidism 01/18/2006  ? Qualifier: Diagnosis of  By: Madilyn Fireman MD, Barnetta Chapel    ? Lower leg edema 08/04/2010  ? On hctz for this(not for blood pressure).    ? Palpitations 09/16/2009  ? Qualifier: Diagnosis of  By: Madilyn Fireman MD, Barnetta Chapel    ? TACHYCARDIA, HX OF 01/18/2006  ? Qualifier: Diagnosis of  By: Madilyn Fireman MD, Barnetta Chapel    ? Type II or unspecified type diabetes mellitus without mention of complication, not stated as uncontrolled 01/17/2013  ? ? ?Past Surgical History:  ?Procedure Laterality Date  ? CARDIAC CATHETERIZATION  2001  ? 30% blockage  ?  CHOLECYSTECTOMY  2003  ? LAP-BAND surgery  April 2007  ? ROBOTIC ASSISTED LAPAROSCOPIC HYSTERECTOMY AND SALPINGECTOMY  2014  ? TUBAL LIGATION  1989  ? ? ?There were no vitals filed for this visit. ? ? Subjective Assessment - 06/13/21 1107   ? ? Subjective Patient reports that she has been doing OK with her exercises. No time to do them yesterday due to activities of Mother's Day.   ? Currently in Pain? Yes   ? Pain Score 2    ? Pain Location Back   ? Pain Orientation Left;Right;Mid;Lower   ? Pain Descriptors / Indicators Tightness;Squeezing   ? Pain Type Chronic pain   ? Pain Onset More than a month ago   ? Pain Frequency Constant   ? Aggravating Factors  upright activity   ? ?  ?  ? ?  ? ? ? ? ? ? ? ? ? ? ? ? ? ? ? ? ? ? ? ? [employer] Adult PT Treatment/Exercise - 06/13/21 0001   ? ?  ? Lumbar Exercises: Stretches  ? Hip Flexor Stretch Right;Left;3 reps;30 seconds   ? Hip Flexor Stretch Limitations sitting   ?  ? Lumbar Exercises: Aerobic  ? Nustep L5 x 5 min   ?  ? Lumbar Exercises: Standing  ? Heel Raises 10 reps;2 seconds   ? Heel Raises Limitations 2 sets alternating with  functional squat   ? Functional Squats 10 reps;3 seconds   ? Functional Squats Limitations 2 sets alternating with heel raises   ? Row Strengthening;Both;20 reps;Theraband   ? Theraband Level (Row) Level 4 (Blue)   ? Shoulder Extension Strengthening;Both;20 reps;Theraband   ? Theraband Level (Shoulder Extension) Level 4 (Blue)   ? Shoulder Adduction Limitations antirotation 10 reps x 2 sets green TB   ? Other Standing Lumbar Exercises hip abduction green TB x 10 reps x 2 sets   ? Other Standing Lumbar Exercises hip extension green TB x 10 reps x 2 sets   ?  ? Lumbar Exercises: Seated  ? Sit to Stand 10 reps   ? ?  ?  ? ?  ? ? ? ? ? ? ? ? ? ? PT Education - 06/13/21 1157   ? ? Education Details HEP   ? Person(s) Educated Patient   ? Methods Explanation;Demonstration;Tactile cues;Verbal cues;Handout   ? Comprehension Verbalized understanding;Returned  demonstration;Verbal cues required;Tactile cues required   ? ?  ?  ? ?  ? ? ? ? ? ? PT Long Term Goals - 06/02/21 1715   ? ?  ? PT LONG TERM GOAL #1  ? Title Pt will be independent with HEP   ? Time 6   ? Period Weeks   ? Status New   ? Target Date 07/14/21   ?  ? PT LONG TERM GOAL #2  ? Title Pt will be able to stand and walk at least 10 minutes without feeling the need to sit   ? Time 6   ? Period Weeks   ? Status New   ? Target Date 07/14/21   ?  ? PT LONG TERM GOAL #3  ? Title Pt will report >/=50% improvement in her pain   ? Time 6   ? Period Weeks   ? Status New   ? Target Date 07/14/21   ?  ? PT LONG TERM GOAL #4  ? Title Pt will have improved FOTO score to 51   ? Time 6   ? Period Weeks   ? Status New   ? Target Date 07/14/21   ?  ? PT LONG TERM GOAL #5  ? Title Pt will demo at least 4+/5 strength in bilat hip abd and ext for improved lumbosacral posture by end of day   ? Time 6   ? Period Weeks   ? Status New   ? Target Date 07/14/21   ? ?  ?  ? ?  ? ? ? ? ? ? ? ? Plan - 06/13/21 1109   ? ? Clinical Impression Statement Good response to initial treatment with no flare up of symptoms. Reviewed and progressed exercises.   ? Rehab Potential Good   ? PT Frequency 2x / week   ? PT Duration 6 weeks   ? PT Treatment/Interventions ADLs/Self Care Home Management;Aquatic Therapy;Electrical Stimulation;Iontophoresis '4mg'$ /ml Dexamethasone;Moist Heat;Cryotherapy;Gait training;Stair training;Functional mobility training;Therapeutic activities;Therapeutic exercise;Neuromuscular re-education;Balance training;Manual techniques;Patient/family education;Passive range of motion;Dry needling;Taping   ? PT Next Visit Plan review and progress HEP. Continue to strengthen glutes, hamstrings, calves; progress to deadlifting   ? PT Home Exercise Plan [email or web address]   ? Consulted and Agree with Plan of Care Patient   ? ?  ?  ? ?  ? ? ?Patient will benefit from skilled therapeutic intervention in order to improve the following deficits and  impairments:    ? ?Visit Diagnosis: ?Muscle weakness (generalized) ? ?  Difficulty in walking, not elsewhere classified ? ?Other abnormalities of gait and mobility ? ?Other low back pain ? ? ? ? ?Problem List ?Patient Active Problem List  ? Diagnosis Date Noted  ? Chronic bilateral low back pain without sciatica 05/17/2021  ? Grief 10/18/2020  ? OSA (obstructive sleep apnea) 09/06/2017  ? Controlled type 2 diabetes mellitus without complication, without long-term current use of insulin ([place]) 03/07/2017  ? Urge incontinence of urine 07/04/2016  ? Pelvic fluid collection 01/16/2013  ? Endometrial carcinoma ([place]) 09/06/2012  ? Lower leg edema 08/04/2010  ? KNEE PAIN, RIGHT 09/16/2009  ? PALPITATIONS 09/16/2009  ? Iatrogenic hypothyroidism 01/18/2006  ? DEFICIENCY, VITAMIN D NOS 01/18/2006  ? HYPERCHOLESTEROLEMIA 01/18/2006  ? OBESITY NOS 01/18/2006  ? IRREGULAR MENSTRUATION 01/18/2006  ? TACHYCARDIA, HX OF 01/18/2006  ? ? ?Everardo All, PT, MPH  ?06/13/2021, 11:57 AM ? ?[place] ?[facility] ?[address] ?[place], [place], [address] ?Phone: [phone]   Fax:  [phone] ? ?Name: Penny Bauer ?MRN: [id number] ?Date of Birth: 1950-09-16 ? ? ? ?

## 2021-06-15 ENCOUNTER — Encounter: Payer: Self-pay | Admitting: Rehabilitative and Restorative Service Providers"

## 2021-06-15 ENCOUNTER — Ambulatory Visit: Payer: Medicare HMO | Admitting: Rehabilitative and Restorative Service Providers"

## 2021-06-15 DIAGNOSIS — G8929 Other chronic pain: Secondary | ICD-10-CM | POA: Diagnosis not present

## 2021-06-15 DIAGNOSIS — M545 Low back pain, unspecified: Secondary | ICD-10-CM | POA: Diagnosis not present

## 2021-06-15 DIAGNOSIS — R262 Difficulty in walking, not elsewhere classified: Secondary | ICD-10-CM | POA: Diagnosis not present

## 2021-06-15 DIAGNOSIS — M5459 Other low back pain: Secondary | ICD-10-CM

## 2021-06-15 DIAGNOSIS — R2689 Other abnormalities of gait and mobility: Secondary | ICD-10-CM | POA: Diagnosis not present

## 2021-06-15 DIAGNOSIS — M6281 Muscle weakness (generalized): Secondary | ICD-10-CM | POA: Diagnosis not present

## 2021-06-15 NOTE — Patient Instructions (Signed)
Standing with back to wall, squeeze butt tight and push hips away from the wall  ?Hold 5 - 10 sec x 10 x 2-3 sets  ?

## 2021-06-15 NOTE — Therapy (Signed)
[place] ?[facility] ?[address] ?[place], [place], [address] ?Phone: [phone]   Fax:  [phone] ? ?Physical Therapy Treatment ? ?Patient Details  ?Name: Penny Bauer ?MRN: [id number] ?Date of Birth: [birth year] ?Referring Provider (PT): Hali Marry, MD ? ? ?Encounter Date: 06/15/2021 ? ? PT End of Session - 06/15/21 1443   ? ? Visit Number 3   ? Number of Visits 12   ? Date for PT Re-Evaluation 07/14/21   ? Authorization Type [employer]   ? Progress Note Due on Visit 10   ? PT Start Time 1443   ? PT Stop Time 1525   ? PT Time Calculation (min) 42 min   ? Activity Tolerance Patient tolerated treatment well   ? ?  ?  ? ?  ? ? ?Past Medical History:  ?Diagnosis Date  ? Adenocarcinoma ([place])   ? Controlled type 2 diabetes mellitus without complication, without long-term current use of insulin ([place]) 03/07/2017  ? DEFICIENCY, VITAMIN D NOS 01/18/2006  ? Qualifier: Diagnosis of  By: Madilyn Fireman MD, Barnetta Chapel    ? Endometrial cancer ([place])   ? Endometrial carcinoma ([place]) 09/06/2012  ? Endometrial biopsy performed on 08/27/2012. Final pathology revealed a patchy a grade 2 endometrial adenocarcinoma. She was referred to Dr. Genia Del at [facility].   ? HYPERCHOLESTEROLEMIA 01/18/2006  ? Qualifier: Diagnosis of  By: Madilyn Fireman MD, Barnetta Chapel    ? Hyperlipidemia   ? Hypertension   ? Hypothyroid   ? Hypothyroidism 09/05/2012  ? Iatrogenic hypothyroidism 01/18/2006  ? Qualifier: Diagnosis of  By: Madilyn Fireman MD, Barnetta Chapel    ? Lower leg edema 08/04/2010  ? On hctz for this(not for blood pressure).    ? Palpitations 09/16/2009  ? Qualifier: Diagnosis of  By: Madilyn Fireman MD, Barnetta Chapel    ? TACHYCARDIA, HX OF 01/18/2006  ? Qualifier: Diagnosis of  By: Madilyn Fireman MD, Barnetta Chapel    ? Type II or unspecified type diabetes mellitus without mention of complication, not stated as uncontrolled 01/17/2013  ? ? ?Past Surgical History:  ?Procedure Laterality Date  ? CARDIAC  CATHETERIZATION  2001  ? 30% blockage  ? CHOLECYSTECTOMY  2003  ? LAP-BAND surgery  April 2007  ? ROBOTIC ASSISTED LAPAROSCOPIC HYSTERECTOMY AND SALPINGECTOMY  2014  ? TUBAL LIGATION  1989  ? ? ?There were no vitals filed for this visit. ? ? Subjective Assessment - 06/15/21 1444   ? ? Subjective Doing OK with exercises.She has some feeling that the legs are tired and warm after the exercises but not painful or weak. She has noticed some "weakness" in both legs in the past few days(less than a week) Not sure what the weak feeling could be from. She started a new medication about a week ago and is wondering if the medication is causing the weakness. Advised patient to contact her pharmacist or MD to discuss possible side effects with medication.   ? Currently in Pain? No/denies   ? Pain Score 0-No pain   ? Pain Location Back   ? Pain Orientation Left;Right;Mid;Lower   ? ?  ?  ? ?  ? ? ? ? ? ? ? ? ? ? ? ? ? ? ? ? ? ? ? ? [facility] Adult PT Treatment/Exercise - 06/15/21 0001   ? ?  ? Self-Care  ? Lifting modified deadlift 10# from stool x 5 reps VC for hinged hip and core engaged   ?  ? Neuro Re-ed   ? Neuro Re-ed  Details  hip extension back to wall pushing hips forward 5 sec hold 10 reps x 2 sets   ?  ? Lumbar Exercises: Stretches  ? Hip Flexor Stretch Right;Left;3 reps;30 seconds   ? Hip Flexor Stretch Limitations sitting   ?  ? Lumbar Exercises: Aerobic  ? Nustep L5 x 5 min   ?  ? Lumbar Exercises: Standing  ? Heel Raises 10 reps;2 seconds   ? Heel Raises Limitations 2 sets alternating with functional squat   ? Functional Squats 10 reps;3 seconds   ? Functional Squats Limitations 2 sets alternating with heel raises   ? Row Strengthening;Both;20 reps;Theraband   ? Theraband Level (Row) Level 4 (Blue)   ? Shoulder Extension Strengthening;Both;20 reps;Theraband   ? Theraband Level (Shoulder Extension) Level 4 (Blue)   ? Shoulder Adduction Limitations antirotation green TB 10 reps x 2 sets   ? Other Standing Lumbar Exercises  hip abduction green TB x 10 reps x 2 sets TB above knees   ? Other Standing Lumbar Exercises hip extension green TB x 10 reps x 2 sets TB above knees   ?  ? Lumbar Exercises: Seated  ? Sit to Stand 10 reps   ? Sit to Stand Limitations VC to engage core   ? ?  ?  ? ?  ? ? ? ? ? ? ? ? ? ? PT Education - 06/15/21 1518   ? ? Education Details HEP   ? Person(s) Educated Patient   ? Methods Explanation;Demonstration;Tactile cues;Verbal cues;Handout   ? Comprehension Verbalized understanding;Returned demonstration;Verbal cues required;Tactile cues required   ? ?  ?  ? ?  ? ? ? ? ? ? PT Long Term Goals - 06/02/21 1715   ? ?  ? PT LONG TERM GOAL #1  ? Title Pt will be independent with HEP   ? Time 6   ? Period Weeks   ? Status New   ? Target Date 07/14/21   ?  ? PT LONG TERM GOAL #2  ? Title Pt will be able to stand and walk at least 10 minutes without feeling the need to sit   ? Time 6   ? Period Weeks   ? Status New   ? Target Date 07/14/21   ?  ? PT LONG TERM GOAL #3  ? Title Pt will report >/=50% improvement in her pain   ? Time 6   ? Period Weeks   ? Status New   ? Target Date 07/14/21   ?  ? PT LONG TERM GOAL #4  ? Title Pt will have improved FOTO score to 51   ? Time 6   ? Period Weeks   ? Status New   ? Target Date 07/14/21   ?  ? PT LONG TERM GOAL #5  ? Title Pt will demo at least 4+/5 strength in bilat hip abd and ext for improved lumbosacral posture by end of day   ? Time 6   ? Period Weeks   ? Status New   ? Target Date 07/14/21   ? ?  ?  ? ?  ? ? ? ? ? ? ? ? Plan - 06/15/21 1456   ? ? Clinical Impression Statement Tolerating exercises well but reports feeling of weakness in both legs possibly associated with new medication. She will contact pharmacist or MD to discuss medication. Reviewed exercises. Modified TB hip abd and ext with TB above knees. One new exercise today due to  c/o generalized weakness in bilat legs. Added trial of deadlift from 8 inch stool   ? Rehab Potential Good   ? PT Frequency 2x / week   ?  PT Duration 6 weeks   ? PT Treatment/Interventions ADLs/Self Care Home Management;Aquatic Therapy;Electrical Stimulation;Iontophoresis '4mg'$ /ml Dexamethasone;Moist Heat;Cryotherapy;Gait training;Stair training;Functional mobility training;Therapeutic activities;Therapeutic exercise;Neuromuscular re-education;Balance training;Manual techniques;Patient/family education;Passive range of motion;Dry needling;Taping   ? PT Next Visit Plan review and progress HEP. Continue to strengthen glutes, hamstrings, calves; progress to deadlifting   ? PT Home Exercise Plan [id number]   ? Consulted and Agree with Plan of Care Patient   ? ?  ?  ? ?  ? ? ?Patient will benefit from skilled therapeutic intervention in order to improve the following deficits and impairments:    ? ?Visit Diagnosis: ?Muscle weakness (generalized) ? ?Difficulty in walking, not elsewhere classified ? ?Other abnormalities of gait and mobility ? ?Other low back pain ? ? ? ? ?Problem List ?Patient Active Problem List  ? Diagnosis Date Noted  ? Chronic bilateral low back pain without sciatica 05/17/2021  ? Grief 10/18/2020  ? OSA (obstructive sleep apnea) 09/06/2017  ? Controlled type 2 diabetes mellitus without complication, without long-term current use of insulin ([place]) 03/07/2017  ? Urge incontinence of urine 07/04/2016  ? Pelvic fluid collection 01/16/2013  ? Endometrial carcinoma ([place]) 09/06/2012  ? Lower leg edema 08/04/2010  ? KNEE PAIN, RIGHT 09/16/2009  ? PALPITATIONS 09/16/2009  ? Iatrogenic hypothyroidism 01/18/2006  ? DEFICIENCY, VITAMIN D NOS 01/18/2006  ? HYPERCHOLESTEROLEMIA 01/18/2006  ? OBESITY NOS 01/18/2006  ? IRREGULAR MENSTRUATION 01/18/2006  ? TACHYCARDIA, HX OF 01/18/2006  ? ? ?Everardo All, PT, MPH  ?06/15/2021, 3:31 PM ? ?[facility] ?[facility] ?[address] ?[place], [place], [address] ?Phone: [phone]   Fax:  [phone] ? ?Name: Penny Bauer ?MRN: [id number] ?Date of Birth:  1950/05/03 ? ? ? ?

## 2021-06-22 ENCOUNTER — Ambulatory Visit: Payer: Medicare HMO | Admitting: Physical Therapy

## 2021-06-22 DIAGNOSIS — R262 Difficulty in walking, not elsewhere classified: Secondary | ICD-10-CM | POA: Diagnosis not present

## 2021-06-22 DIAGNOSIS — M5459 Other low back pain: Secondary | ICD-10-CM | POA: Diagnosis not present

## 2021-06-22 DIAGNOSIS — R2689 Other abnormalities of gait and mobility: Secondary | ICD-10-CM

## 2021-06-22 DIAGNOSIS — M545 Low back pain, unspecified: Secondary | ICD-10-CM | POA: Diagnosis not present

## 2021-06-22 DIAGNOSIS — M6281 Muscle weakness (generalized): Secondary | ICD-10-CM

## 2021-06-22 DIAGNOSIS — G8929 Other chronic pain: Secondary | ICD-10-CM | POA: Diagnosis not present

## 2021-06-22 NOTE — Therapy (Signed)
[place], [place], [address] Phone: [phone]   Fax:  [phone]  Physical Therapy Treatment  Patient Details  Name: Penny Bauer MRN: [id number] Date of Birth: 11-16-1950 Referring Provider (PT): Hali Marry, MD   Encounter Date: 06/22/2021   PT End of Session - 06/22/21 1102     Visit Number 4    Number of Visits 12    Date for PT Re-Evaluation 07/14/21    Authorization Type [employer]    Progress Note Due on Visit 10    PT Start Time 1102    PT Stop Time 1145    PT Time Calculation (min) 43 min    Activity Tolerance Patient tolerated treatment well    Behavior During Therapy [facility] for tasks assessed/performed             Past Medical History:  Diagnosis Date   Adenocarcinoma ([place])    Controlled type 2 diabetes mellitus without complication, without long-term current use of insulin ([place]) 03/07/2017   DEFICIENCY, VITAMIN D NOS 01/18/2006   Qualifier: Diagnosis of  By: Madilyn Fireman MD, [place]     Endometrial cancer [facility])    Endometrial carcinoma ([place]) 09/06/2012   Endometrial biopsy performed on 08/27/2012. Final pathology revealed a patchy a grade 2 endometrial adenocarcinoma. She was referred to Dr. Genia Del at [facility].    HYPERCHOLESTEROLEMIA 01/18/2006   Qualifier: Diagnosis of  By: Madilyn Fireman MD, [place]     Hyperlipidemia    Hypertension    Hypothyroid    Hypothyroidism 09/05/2012   Iatrogenic hypothyroidism 01/18/2006   Qualifier: Diagnosis of  By: Madilyn Fireman MD, Barnetta Chapel     Lower leg edema 08/04/2010   On hctz for this(not for blood pressure).     Palpitations 09/16/2009   Qualifier: Diagnosis of  By: Madilyn Fireman MD, Zettie Pho, HX OF 01/18/2006   Qualifier: Diagnosis of  By: Madilyn Fireman MD, Catherine     Type II or unspecified type diabetes mellitus without mention of complication, not stated as uncontrolled 01/17/2013    Past Surgical  History:  Procedure Laterality Date   CARDIAC CATHETERIZATION  2001   30% blockage   CHOLECYSTECTOMY  2003   LAP-BAND surgery  April 2007   ROBOTIC ASSISTED LAPAROSCOPIC HYSTERECTOMY AND SALPINGECTOMY  2014   TUBAL LIGATION  1989    There were no vitals filed for this visit.   Subjective Assessment - 06/22/21 1104     Subjective Pt states that she did check on her meds but thinks it should improve. Has been doing her exercises. Thinks she might have lost a few pages.    Limitations Sitting;Standing;Walking    How long can you sit comfortably? No issues    How long can you stand comfortably? <5 min    How long can you walk comfortably? Walks when have to but does not do extra. Able to walk in grocery store if leaning on cart    Patient Stated Goals Walk and stand longer without having to look for a chair -- walk dog again    Currently in Pain? No/denies                [facility] PT Assessment - 06/22/21 0001       Assessment   Medical Diagnosis M54.50,G89.29 (ICD-10-CM) - Chronic bilateral low back pain without sciatica    Referring Provider (PT) Hali Marry, MD    Hand Dominance Left  [facility] Adult PT Treatment/Exercise - 06/22/21 0001       Self-Care   Lifting modified deadlift 10# from stool x 5 reps VC for hinged hip and core engaged      Lumbar Exercises: Stretches   Hip Flexor Stretch Right;Left;3 reps;30 seconds    Hip Flexor Stretch Limitations sitting    Other Lumbar Stretch Exercise Lumbar extension 3x 5sec      Lumbar Exercises: Aerobic   Nustep L5 x 5 min      Lumbar Exercises: Standing   Heel Raises 10 reps;2 seconds    Heel Raises Limitations 2 sets    Row Strengthening;Both;20 reps;Theraband    Theraband Level (Row) Level 4 (Blue)    Shoulder Extension Strengthening;Both;20 reps;Theraband    Theraband Level (Shoulder Extension) Level 4 (Blue)    Shoulder Adduction Limitations antirotation green TB 10  reps x 2 sets; modified plank on counter 2x30 sec    Other Standing Lumbar Exercises hip abduction green TB x 10 reps x 2 sets TB below knees    Other Standing Lumbar Exercises hip extension green TB x 10 reps x 2 sets TB below knees      Lumbar Exercises: Seated   Sit to Stand 20 reps    Sit to Stand Limitations VC to engage core with 5#                          PT Long Term Goals - 06/02/21 1715       PT LONG TERM GOAL #1   Title Pt will be independent with HEP    Time 6    Period Weeks    Status New    Target Date 07/14/21      PT LONG TERM GOAL #2   Title Pt will be able to stand and walk at least 10 minutes without feeling the need to sit    Time 6    Period Weeks    Status New    Target Date 07/14/21      PT LONG TERM GOAL #3   Title Pt will report >/=50% improvement in her pain    Time 6    Period Weeks    Status New    Target Date 07/14/21      PT LONG TERM GOAL #4   Title Pt will have improved FOTO score to 51    Time 6    Period Weeks    Status New    Target Date 07/14/21      PT LONG TERM GOAL #5   Title Pt will demo at least 4+/5 strength in bilat hip abd and ext for improved lumbosacral posture by end of day    Time 6    Period Weeks    Status New    Target Date 07/14/21                   Plan - 06/22/21 1116     Clinical Impression Statement Continued to work on progressing her strengthening exercises. Able to tolerate sit to stand with weight.    Personal Factors and Comorbidities Age;Time since onset of injury/illness/exacerbation;Past/Current Experience    Examination-Activity Limitations Locomotion Level;Stand    Examination-Participation Restrictions Cleaning;Community Activity;Yard Work    Rehab Potential Good    PT Frequency 2x / week    PT Duration 6 weeks    PT Treatment/Interventions ADLs/Self Care Home Management;Aquatic Therapy;Electrical Stimulation;Iontophoresis '4mg'$ /ml Dexamethasone;Moist  Heat;Cryotherapy;Gait training;Stair  training;Functional mobility training;Therapeutic activities;Therapeutic exercise;Neuromuscular re-education;Balance training;Manual techniques;Patient/family education;Passive range of motion;Dry needling;Taping    PT Next Visit Plan review and progress HEP. Continue to strengthen glutes, hamstrings, calves; progress to deadlifting    PT Home Exercise Plan [email or web address]    Consulted and Agree with Plan of Care Patient             Patient will benefit from skilled therapeutic intervention in order to improve the following deficits and impairments:  Difficulty walking, Abnormal gait, Pain, Decreased mobility, Decreased strength, Improper body mechanics, Postural dysfunction  Visit Diagnosis: Muscle weakness (generalized)  Difficulty in walking, not elsewhere classified  Other abnormalities of gait and mobility  Other low back pain     Problem List Patient Active Problem List   Diagnosis Date Noted   Chronic bilateral low back pain without sciatica 05/17/2021   Grief 10/18/2020   OSA (obstructive sleep apnea) 09/06/2017   Controlled type 2 diabetes mellitus without complication, without long-term current use of insulin ([place]) 03/07/2017   Urge incontinence of urine 07/04/2016   Pelvic fluid collection 01/16/2013   Endometrial carcinoma ([place]) 09/06/2012   Lower leg edema 08/04/2010   KNEE PAIN, RIGHT 09/16/2009   PALPITATIONS 09/16/2009   Iatrogenic hypothyroidism 01/18/2006   DEFICIENCY, VITAMIN D NOS 01/18/2006   HYPERCHOLESTEROLEMIA 01/18/2006   OBESITY NOS 01/18/2006   IRREGULAR MENSTRUATION 01/18/2006   TACHYCARDIA, HX OF 01/18/2006    Kaylina Cahue April Gordy Levan, PT, DPT 06/22/2021, 11:37 AM  [facility] [place], [place], [address] Phone: [phone]   Fax:  [phone]  Name: Penny Bauer MRN: [id number] Date of Birth: September 22, 1950

## 2021-06-24 ENCOUNTER — Ambulatory Visit: Payer: Medicare HMO | Admitting: Physical Therapy

## 2021-06-24 DIAGNOSIS — R2689 Other abnormalities of gait and mobility: Secondary | ICD-10-CM | POA: Diagnosis not present

## 2021-06-24 DIAGNOSIS — G8929 Other chronic pain: Secondary | ICD-10-CM | POA: Diagnosis not present

## 2021-06-24 DIAGNOSIS — M5459 Other low back pain: Secondary | ICD-10-CM | POA: Diagnosis not present

## 2021-06-24 DIAGNOSIS — M6281 Muscle weakness (generalized): Secondary | ICD-10-CM | POA: Diagnosis not present

## 2021-06-24 DIAGNOSIS — R262 Difficulty in walking, not elsewhere classified: Secondary | ICD-10-CM | POA: Diagnosis not present

## 2021-06-24 DIAGNOSIS — M545 Low back pain, unspecified: Secondary | ICD-10-CM | POA: Diagnosis not present

## 2021-06-24 NOTE — Therapy (Signed)
Penny Bauer, [place], [address] Phone: [phone]   Fax:  [phone]  Physical Therapy Treatment  Patient Details  Name: Penny Bauer MRN: [id number] Date of Birth: 1950-06-03 Referring Provider (PT): Hali Marry, Bauer   Encounter Date: 06/24/2021   PT End of Session - 06/24/21 1021     Visit Number 5    Number of Visits 12    Date for PT Re-Evaluation 07/14/21    Authorization Type [employer]    Progress Note Due on Visit 10    PT Start Time 1021    PT Stop Time 1100    PT Time Calculation (min) 39 min    Activity Tolerance Patient tolerated treatment well    Behavior During Therapy [facility] for tasks assessed/performed             Past Medical History:  Diagnosis Date   Adenocarcinoma ([place])    Controlled type 2 diabetes mellitus without complication, without long-term current use of insulin ([place]) 03/07/2017   DEFICIENCY, VITAMIN D NOS 01/18/2006   Qualifier: Diagnosis of  By: Penny Bauer, Penny Bauer     Endometrial cancer [facility])    Endometrial carcinoma ([place]) 09/06/2012   Endometrial biopsy performed on 08/27/2012. Final pathology revealed a patchy a grade 2 endometrial adenocarcinoma. She was referred to Dr. Genia Bauer at [facility].    HYPERCHOLESTEROLEMIA 01/18/2006   Qualifier: Diagnosis of  By: Penny Bauer, Penny Bauer     Hyperlipidemia    Hypertension    Hypothyroid    Hypothyroidism 09/05/2012   Iatrogenic hypothyroidism 01/18/2006   Qualifier: Diagnosis of  By: Penny Bauer, Penny Bauer     Lower leg edema 08/04/2010   On hctz for this(not for blood pressure).     Palpitations 09/16/2009   Qualifier: Diagnosis of  By: Penny Bauer, Penny Bauer, HX OF 01/18/2006   Qualifier: Diagnosis of  By: Penny Bauer, Penny Bauer     Type II or unspecified type diabetes mellitus without mention of complication, not stated as uncontrolled 01/17/2013    Past Surgical  History:  Procedure Laterality Date   CARDIAC CATHETERIZATION  2001   30% blockage   CHOLECYSTECTOMY  2003   LAP-BAND surgery  April 2007   ROBOTIC ASSISTED LAPAROSCOPIC HYSTERECTOMY AND SALPINGECTOMY  2014   TUBAL LIGATION  1989    There were no vitals filed for this visit.   Subjective Assessment - 06/24/21 1025     Subjective Pt reports her legs are a little achy. Has been doing house work.    Limitations Sitting;Standing;Walking    How long can you sit comfortably? No issues    How long can you stand comfortably? <5 min    How long can you walk comfortably? Walks when have to but does not do extra. Able to walk in grocery store if leaning on cart    Patient Stated Goals Walk and stand longer without having to look for a chair -- walk dog again    Currently in Pain? Yes    Pain Score 1     Pain Location Back    Pain Descriptors / Indicators Tightness                [facility] PT Assessment - 06/24/21 0001       Assessment   Medical Diagnosis M54.50,G89.29 (ICD-10-CM) - Chronic bilateral low back pain without sciatica    Referring Provider (PT) Hali Marry, Bauer  [facility] Adult PT Treatment/Exercise - 06/24/21 0001       Lumbar Exercises: Stretches   Other Lumbar Stretch Exercise Lumbar extension 3x 20sec      Lumbar Exercises: Aerobic   Nustep L5 x 5 min      Lumbar Exercises: Standing   Heel Raises 10 reps;2 seconds    Heel Raises Limitations 2 sets    Row Strengthening;Both;20 reps;Theraband    Theraband Level (Row) Level 4 (Blue)    Row Limitations reactive iso    Shoulder Adduction Limitations modified plank on counter 2x30 sec; modified plank with alternating UE and LE raises 2x10; low trap setting on door x30    Other Standing Lumbar Exercises hip abduction green TB x 10 reps x 3 sets TB below knees    Other Standing Lumbar Exercises hip extension green TB x 10 reps x 3 sets TB below knees      Lumbar  Exercises: Seated   Sit to Stand 20 reps    Sit to Stand Limitations VC to engage core with 5#                          PT Long Term Goals - 06/02/21 1715       PT LONG TERM GOAL #1   Title Pt will be independent with HEP    Time 6    Period Weeks    Status New    Target Date 07/14/21      PT LONG TERM GOAL #2   Title Pt will be able to stand and walk at least 10 minutes without feeling the need to sit    Time 6    Period Weeks    Status New    Target Date 07/14/21      PT LONG TERM GOAL #3   Title Pt will report >/=50% improvement in her pain    Time 6    Period Weeks    Status New    Target Date 07/14/21      PT LONG TERM GOAL #4   Title Pt will have improved FOTO score to 51    Time 6    Period Weeks    Status New    Target Date 07/14/21      PT LONG TERM GOAL #5   Title Pt will demo at least 4+/5 strength in bilat hip abd and ext for improved lumbosacral posture by end of day    Time 6    Period Weeks    Status New    Target Date 07/14/21                   Plan - 06/24/21 1037     Clinical Impression Statement Worked on progressing pt's trunk extensor endurance. Increased pt's reps to focus more on muscular endurance    Personal Factors and Comorbidities Age;Time since onset of injury/illness/exacerbation;Past/Current Experience    Examination-Activity Limitations Locomotion Level;Stand    Examination-Participation Restrictions Cleaning;Community Activity;Yard Work    Rehab Potential Good    PT Frequency 2x / week    PT Duration 6 weeks    PT Treatment/Interventions ADLs/Self Care Home Management;Aquatic Therapy;Electrical Stimulation;Iontophoresis '4mg'$ /ml Dexamethasone;Moist Heat;Cryotherapy;Gait training;Stair training;Functional mobility training;Therapeutic activities;Therapeutic exercise;Neuromuscular re-education;Balance training;Manual techniques;Patient/family education;Passive range of motion;Dry needling;Taping    PT Next  Visit Plan review and progress HEP. Continue to strengthen glutes, hamstrings, calves; progress to [place]  Consulted and Agree with Plan of Care Patient             Patient will benefit from skilled therapeutic intervention in order to improve the following deficits and impairments:  Difficulty walking, Abnormal gait, Pain, Decreased mobility, Decreased strength, Improper body mechanics, Postural dysfunction  Visit Diagnosis: Muscle weakness (generalized)  Difficulty in walking, not elsewhere classified  Other abnormalities of gait and mobility  Other low back pain     Problem List Patient Active Problem List   Diagnosis Date Noted   Chronic bilateral low back pain without sciatica 05/17/2021   Grief 10/18/2020   OSA (obstructive sleep apnea) 09/06/2017   Controlled type 2 diabetes mellitus without complication, without long-term current use of insulin ([place]) 03/07/2017   Urge incontinence of urine 07/04/2016   Pelvic fluid collection 01/16/2013   Endometrial carcinoma ([place]) 09/06/2012   Lower leg edema 08/04/2010   KNEE PAIN, RIGHT 09/16/2009   PALPITATIONS 09/16/2009   Iatrogenic hypothyroidism 01/18/2006   DEFICIENCY, VITAMIN D NOS 01/18/2006   HYPERCHOLESTEROLEMIA 01/18/2006   OBESITY NOS 01/18/2006   IRREGULAR MENSTRUATION 01/18/2006   TACHYCARDIA, HX OF 01/18/2006    Rosario Duey April Ma L Tawonna Esquer, PT, DPT 06/24/2021, 11:00 AM  [facility] [place] [address] [place], [place], [address] Phone: [phone]   Fax:  [phone]  Name: AZAYLAH STAILEY MRN: [id number] Date of Birth: [birth year]

## 2021-06-29 ENCOUNTER — Ambulatory Visit: Payer: Medicare HMO | Admitting: Physical Therapy

## 2021-06-29 DIAGNOSIS — M545 Low back pain, unspecified: Secondary | ICD-10-CM | POA: Diagnosis not present

## 2021-06-29 DIAGNOSIS — R262 Difficulty in walking, not elsewhere classified: Secondary | ICD-10-CM

## 2021-06-29 DIAGNOSIS — G8929 Other chronic pain: Secondary | ICD-10-CM | POA: Diagnosis not present

## 2021-06-29 DIAGNOSIS — R2689 Other abnormalities of gait and mobility: Secondary | ICD-10-CM

## 2021-06-29 DIAGNOSIS — M6281 Muscle weakness (generalized): Secondary | ICD-10-CM

## 2021-06-29 DIAGNOSIS — M5459 Other low back pain: Secondary | ICD-10-CM

## 2021-06-29 NOTE — Therapy (Signed)
[place], [place], [address] Phone: [phone]   Fax:  [phone]  Physical Therapy Treatment  Patient Details  Name: Penny Bauer MRN: [id number] Date of Birth: 1951/01/13 Referring Provider (PT): Hali Marry, MD   Encounter Date: 06/29/2021   PT End of Session - 06/29/21 1104     Visit Number 6    Number of Visits 12    Date for PT Re-Evaluation 07/14/21    Authorization Type [employer]    Progress Note Due on Visit 10    PT Start Time 1104    PT Stop Time 1145    PT Time Calculation (min) 41 min    Activity Tolerance Patient tolerated treatment well    Behavior During Therapy [facility] for tasks assessed/performed             Past Medical History:  Diagnosis Date   Adenocarcinoma ([place])    Controlled type 2 diabetes mellitus without complication, without long-term current use of insulin ([place]) 03/07/2017   DEFICIENCY, VITAMIN D NOS 01/18/2006   Qualifier: Diagnosis of  By: Madilyn Fireman MD, Catherine     Endometrial cancer [facility])    Endometrial carcinoma ([place]) 09/06/2012   Endometrial biopsy performed on 08/27/2012. Final pathology revealed a patchy a grade 2 endometrial adenocarcinoma. She was referred to Dr. Genia Del at [place].    HYPERCHOLESTEROLEMIA 01/18/2006   Qualifier: Diagnosis of  By: Madilyn Fireman MD, Catherine     Hyperlipidemia    Hypertension    Hypothyroid    Hypothyroidism 09/05/2012   Iatrogenic hypothyroidism 01/18/2006   Qualifier: Diagnosis of  By: Madilyn Fireman MD, Barnetta Chapel     Lower leg edema 08/04/2010   On hctz for this(not for blood pressure).     Palpitations 09/16/2009   Qualifier: Diagnosis of  By: Madilyn Fireman MD, Zettie Pho, HX OF 01/18/2006   Qualifier: Diagnosis of  By: Madilyn Fireman MD, Catherine     Type II or unspecified type diabetes mellitus without mention of complication, not stated as uncontrolled 01/17/2013    Past Surgical  History:  Procedure Laterality Date   CARDIAC CATHETERIZATION  2001   30% blockage   CHOLECYSTECTOMY  2003   LAP-BAND surgery  April 2007   ROBOTIC ASSISTED LAPAROSCOPIC HYSTERECTOMY AND SALPINGECTOMY  2014   TUBAL LIGATION  1989    There were no vitals filed for this visit.   Subjective Assessment - 06/29/21 1107     Subjective Pt reports nothing new or different. Pt's daughter is to get surgery this Friday and pt has been trying to get everything arranged.    Limitations Sitting;Standing;Walking    How long can you sit comfortably? No issues    How long can you stand comfortably? <5 min    How long can you walk comfortably? Walks when have to but does not do extra. Able to walk in grocery store if leaning on cart    Patient Stated Goals Walk and stand longer without having to look for a chair -- walk dog again    Currently in Pain? Yes    Pain Score 1     Pain Location Back    Pain Orientation Left                [facility] PT Assessment - 06/29/21 0001       Assessment   Medical Diagnosis M54.50,G89.29 (ICD-10-CM) - Chronic bilateral low back pain without sciatica    Referring  Provider (PT) Hali Marry, MD                           [facility] Adult PT Treatment/Exercise - 06/29/21 0001       Lumbar Exercises: Aerobic   Tread Mill 1.5 mph x 5 min      Lumbar Exercises: Standing   Row Strengthening;Both;20 reps;Theraband    Theraband Level (Row) Level 4 (Blue)    Row Limitations reactive iso    Shoulder Extension Strengthening;Both;20 reps;Theraband    Theraband Level (Shoulder Extension) Level 4 (Blue)    Shoulder Extension Limitations reactive iso    Shoulder Adduction Limitations modified plank with alternating UE and LE raises 2x10; low trap setting on door 2x10; bow and arrow x10 blue tband    Other Standing Lumbar Exercises side stepping red tband by counter 2x 10'    Other Standing Lumbar Exercises backwards walk with red tband 2x10'       Lumbar Exercises: Seated   Sit to Stand 20 reps    Sit to Stand Limitations VC to engage core with green tband                          PT Long Term Goals - 06/02/21 1715       PT LONG TERM GOAL #1   Title Pt will be independent with HEP    Time 6    Period Weeks    Status New    Target Date 07/14/21      PT LONG TERM GOAL #2   Title Pt will be able to stand and walk at least 10 minutes without feeling the need to sit    Time 6    Period Weeks    Status New    Target Date 07/14/21      PT LONG TERM GOAL #3   Title Pt will report >/=50% improvement in her pain    Time 6    Period Weeks    Status New    Target Date 07/14/21      PT LONG TERM GOAL #4   Title Pt will have improved FOTO score to 51    Time 6    Period Weeks    Status New    Target Date 07/14/21      PT LONG TERM GOAL #5   Title Pt will demo at least 4+/5 strength in bilat hip abd and ext for improved lumbosacral posture by end of day    Time 6    Period Weeks    Status New    Target Date 07/14/21                   Plan - 06/29/21 1121     Clinical Impression Statement Continued work on progressing core and trunk strength and endurance.    Personal Factors and Comorbidities Age;Time since onset of injury/illness/exacerbation;Past/Current Experience    Examination-Activity Limitations Locomotion Level;Stand    Examination-Participation Restrictions Cleaning;Community Activity;Yard Work    Rehab Potential Good    PT Frequency 2x / week    PT Duration 6 weeks    PT Treatment/Interventions ADLs/Self Care Home Management;Aquatic Therapy;Electrical Stimulation;Iontophoresis '4mg'$ /ml Dexamethasone;Moist Heat;Cryotherapy;Gait training;Stair training;Functional mobility training;Therapeutic activities;Therapeutic exercise;Neuromuscular re-education;Balance training;Manual techniques;Patient/family education;Passive range of motion;Dry needling;Taping    PT Next Visit Plan review and  progress HEP. Continue to strengthen glutes, hamstrings, calves; progress to deadlifting  PT Home Exercise Plan [email or web address]    Consulted and Agree with Plan of Care Patient             Patient will benefit from skilled therapeutic intervention in order to improve the following deficits and impairments:  Difficulty walking, Abnormal gait, Pain, Decreased mobility, Decreased strength, Improper body mechanics, Postural dysfunction  Visit Diagnosis: Muscle weakness (generalized)  Difficulty in walking, not elsewhere classified  Other abnormalities of gait and mobility  Other low back pain     Problem List Patient Active Problem List   Diagnosis Date Noted   Chronic bilateral low back pain without sciatica 05/17/2021   Grief 10/18/2020   OSA (obstructive sleep apnea) 09/06/2017   Controlled type 2 diabetes mellitus without complication, without long-term current use of insulin ([place]) 03/07/2017   Urge incontinence of urine 07/04/2016   Pelvic fluid collection 01/16/2013   Endometrial carcinoma ([place]) 09/06/2012   Lower leg edema 08/04/2010   KNEE PAIN, RIGHT 09/16/2009   PALPITATIONS 09/16/2009   Iatrogenic hypothyroidism 01/18/2006   DEFICIENCY, VITAMIN D NOS 01/18/2006   HYPERCHOLESTEROLEMIA 01/18/2006   OBESITY NOS 01/18/2006   IRREGULAR MENSTRUATION 01/18/2006   TACHYCARDIA, HX OF 01/18/2006    Jamey Demchak April Ma L Anis Cinelli, PT, DPT 06/29/2021, 11:43 AM  [facility] [place], [place], [address] Phone: [phone]   Fax:  [phone]  Name: Penny Bauer MRN: [id number] Date of Birth: [birth year]

## 2021-07-01 ENCOUNTER — Encounter: Payer: Medicare HMO | Admitting: Physical Therapy

## 2021-07-05 ENCOUNTER — Encounter: Payer: Medicare HMO | Admitting: Physical Therapy

## 2021-07-07 ENCOUNTER — Ambulatory Visit: Payer: Medicare HMO | Attending: Family Medicine | Admitting: Physical Therapy

## 2021-07-07 DIAGNOSIS — M5459 Other low back pain: Secondary | ICD-10-CM | POA: Insufficient documentation

## 2021-07-07 DIAGNOSIS — R262 Difficulty in walking, not elsewhere classified: Secondary | ICD-10-CM | POA: Insufficient documentation

## 2021-07-07 DIAGNOSIS — M6281 Muscle weakness (generalized): Secondary | ICD-10-CM | POA: Insufficient documentation

## 2021-07-07 DIAGNOSIS — R2689 Other abnormalities of gait and mobility: Secondary | ICD-10-CM | POA: Diagnosis not present

## 2021-07-07 NOTE — Therapy (Signed)
[place], [place], [address] Phone: [phone]   Fax:  [phone]  Physical Therapy Treatment  Patient Details  Name: Penny Bauer MRN: [id number] Date of Birth: 07-08-1950 Referring Provider (PT): Hali Marry, MD   Encounter Date: 07/07/2021   PT End of Session - 07/07/21 0932     Visit Number 7    Number of Visits 12    Date for PT Re-Evaluation 07/14/21    Authorization Type [employer]    Progress Note Due on Visit 10    PT Start Time 0932    PT Stop Time 1015    PT Time Calculation (min) 43 min    Activity Tolerance Patient tolerated treatment well    Behavior During Therapy [facility] for tasks assessed/performed             Past Medical History:  Diagnosis Date   Adenocarcinoma ([place])    Controlled type 2 diabetes mellitus without complication, without long-term current use of insulin ([place]) 03/07/2017   DEFICIENCY, VITAMIN D NOS 01/18/2006   Qualifier: Diagnosis of  By: Madilyn Fireman MD, Catherine     Endometrial cancer [facility])    Endometrial carcinoma ([place]) 09/06/2012   Endometrial biopsy performed on 08/27/2012. Final pathology revealed a patchy a grade 2 endometrial adenocarcinoma. She was referred to Dr. Genia Del at [place].    HYPERCHOLESTEROLEMIA 01/18/2006   Qualifier: Diagnosis of  By: Madilyn Fireman MD, Catherine     Hyperlipidemia    Hypertension    Hypothyroid    Hypothyroidism 09/05/2012   Iatrogenic hypothyroidism 01/18/2006   Qualifier: Diagnosis of  By: Madilyn Fireman MD, Barnetta Chapel     Lower leg edema 08/04/2010   On hctz for this(not for blood pressure).     Palpitations 09/16/2009   Qualifier: Diagnosis of  By: Madilyn Fireman MD, Zettie Pho, HX OF 01/18/2006   Qualifier: Diagnosis of  By: Madilyn Fireman MD, Catherine     Type II or unspecified type diabetes mellitus without mention of complication, not stated as uncontrolled 01/17/2013    Past Surgical  History:  Procedure Laterality Date   CARDIAC CATHETERIZATION  2001   30% blockage   CHOLECYSTECTOMY  2003   LAP-BAND surgery  April 2007   ROBOTIC ASSISTED LAPAROSCOPIC HYSTERECTOMY AND SALPINGECTOMY  2014   TUBAL LIGATION  1989    There were no vitals filed for this visit.   Subjective Assessment - 07/07/21 0934     Subjective Pt states she's been going in/out running errands for her daughter since the surgery. Pt reports it's been a rough week. Pt states she hasn't been able to do all of her exercises. Her back has been okay.    Limitations Sitting;Standing;Walking    How long can you sit comfortably? No issues    How long can you stand comfortably? <5 min    How long can you walk comfortably? Walks when have to but does not do extra. Able to walk in grocery store if leaning on cart    Patient Stated Goals Walk and stand longer without having to look for a chair -- walk dog again    Currently in Pain? Yes    Pain Score 1     Pain Location Back    Pain Type Chronic pain                [facility] PT Assessment - 07/07/21 0001       Assessment  Medical Diagnosis M54.50,G89.29 (ICD-10-CM) - Chronic bilateral low back pain without sciatica    Referring Provider (PT) Hali Marry, MD                           [facility] Adult PT Treatment/Exercise - 07/07/21 0001       Lumbar Exercises: Aerobic   Nustep L5 x 5 min      Lumbar Exercises: Standing   Heel Raises 10 reps;2 seconds    Heel Raises Limitations 2 sets    Row Strengthening;Both;Theraband    Theraband Level (Row) Level 4 (Blue)    Row Limitations x30 reactive iso    Other Standing Lumbar Exercises modified plank with alternating UE and LE raises 2x10; low trap setting on door 2x10; bow and arrow x10 blue tband      Lumbar Exercises: Seated   Sit to Stand 20 reps    Sit to Stand Limitations VC to engage core with green tband      Lumbar Exercises: Supine   Bridge Compliant;20 reps     Other Supine Lumbar Exercises hamstring curl with orange pball 2x10    Other Supine Lumbar Exercises hip extension using pball x10      Lumbar Exercises: Sidelying   Hip Abduction Right;Left;20 reps                          PT Long Term Goals - 06/02/21 1715       PT LONG TERM GOAL #1   Title Pt will be independent with HEP    Time 6    Period Weeks    Status New    Target Date 07/14/21      PT LONG TERM GOAL #2   Title Pt will be able to stand and walk at least 10 minutes without feeling the need to sit    Time 6    Period Weeks    Status New    Target Date 07/14/21      PT LONG TERM GOAL #3   Title Pt will report >/=50% improvement in her pain    Time 6    Period Weeks    Status New    Target Date 07/14/21      PT LONG TERM GOAL #4   Title Pt will have improved FOTO score to 51    Time 6    Period Weeks    Status New    Target Date 07/14/21      PT LONG TERM GOAL #5   Title Pt will demo at least 4+/5 strength in bilat hip abd and ext for improved lumbosacral posture by end of day    Time 6    Period Weeks    Status New    Target Date 07/14/21                   Plan - 07/07/21 0941     Clinical Impression Statement Pt with difficulty performing side stepping with resistance using good form. Better form in sidelying noted. Continued to progress core and hip strengthening.    Personal Factors and Comorbidities Age;Time since onset of injury/illness/exacerbation;Past/Current Experience    Examination-Activity Limitations Locomotion Level;Stand    Examination-Participation Restrictions Cleaning;Community Activity;Yard Work    Rehab Potential Good    PT Frequency 2x / week    PT Duration 6 weeks    PT Treatment/Interventions ADLs/Self Care Home Management;Aquatic  Therapy;Electrical Stimulation;Iontophoresis '4mg'$ /ml Dexamethasone;Moist Heat;Cryotherapy;Gait training;Stair training;Functional mobility training;Therapeutic  activities;Therapeutic exercise;Neuromuscular re-education;Balance training;Manual techniques;Patient/family education;Passive range of motion;Dry needling;Taping    PT Next Visit Plan review and progress HEP. Continue to strengthen glutes, hamstrings, calves; progress to deadlifting    PT Home Exercise Plan [email or web address]    Consulted and Agree with Plan of Care Patient             Patient will benefit from skilled therapeutic intervention in order to improve the following deficits and impairments:  Difficulty walking, Abnormal gait, Pain, Decreased mobility, Decreased strength, Improper body mechanics, Postural dysfunction  Visit Diagnosis: Muscle weakness (generalized)  Difficulty in walking, not elsewhere classified  Other abnormalities of gait and mobility  Other low back pain     Problem List Patient Active Problem List   Diagnosis Date Noted   Chronic bilateral low back pain without sciatica 05/17/2021   Grief 10/18/2020   OSA (obstructive sleep apnea) 09/06/2017   Controlled type 2 diabetes mellitus without complication, without long-term current use of insulin ([place]) 03/07/2017   Urge incontinence of urine 07/04/2016   Pelvic fluid collection 01/16/2013   Endometrial carcinoma ([place]) 09/06/2012   Lower leg edema 08/04/2010   KNEE PAIN, RIGHT 09/16/2009   PALPITATIONS 09/16/2009   Iatrogenic hypothyroidism 01/18/2006   DEFICIENCY, VITAMIN D NOS 01/18/2006   HYPERCHOLESTEROLEMIA 01/18/2006   OBESITY NOS 01/18/2006   IRREGULAR MENSTRUATION 01/18/2006   TACHYCARDIA, HX OF 01/18/2006    Penny Bauer, PT, DPT 07/07/2021, 10:06 AM  [facility] [place], [place], [address] Phone: [phone]   Fax:  [phone]  Name: Penny Bauer MRN: [id number] Date of Birth: 08-Mar-1950

## 2021-07-11 ENCOUNTER — Ambulatory Visit: Payer: Medicare HMO | Admitting: Physical Therapy

## 2021-07-11 DIAGNOSIS — M6281 Muscle weakness (generalized): Secondary | ICD-10-CM | POA: Diagnosis not present

## 2021-07-11 DIAGNOSIS — R2689 Other abnormalities of gait and mobility: Secondary | ICD-10-CM

## 2021-07-11 DIAGNOSIS — R262 Difficulty in walking, not elsewhere classified: Secondary | ICD-10-CM | POA: Diagnosis not present

## 2021-07-11 DIAGNOSIS — M5459 Other low back pain: Secondary | ICD-10-CM

## 2021-07-11 NOTE — Therapy (Signed)
[place], [place], [address] Phone: [phone]   Fax:  [phone]  Physical Therapy Treatment and Re-Cert  Patient Details  Name: Penny Bauer MRN: [id number] Date of Birth: Oct 31, 1950 Referring Provider (PT): Hali Marry, MD  Rationale for Evaluation and Treatment Rehabilitation  Encounter Date: 07/11/2021   PT End of Session - 07/11/21 0933     Visit Number 8    Number of Visits 12    Date for PT Re-Evaluation 07/14/21    Authorization Type [employer]    Progress Note Due on Visit 10    PT Start Time 0933    PT Stop Time 1015    PT Time Calculation (min) 42 min    Activity Tolerance Patient tolerated treatment well    Behavior During Therapy [facility] for tasks assessed/performed             Past Medical History:  Diagnosis Date   Adenocarcinoma ([place])    Controlled type 2 diabetes mellitus without complication, without long-term current use of insulin ([place]) 03/07/2017   DEFICIENCY, VITAMIN D NOS 01/18/2006   Qualifier: Diagnosis of  By: Madilyn Fireman MD, Catherine     Endometrial cancer [facility])    Endometrial carcinoma ([place]) 09/06/2012   Endometrial biopsy performed on 08/27/2012. Final pathology revealed a patchy a grade 2 endometrial adenocarcinoma. She was referred to Dr. Genia Del at [place].    HYPERCHOLESTEROLEMIA 01/18/2006   Qualifier: Diagnosis of  By: Madilyn Fireman MD, Catherine     Hyperlipidemia    Hypertension    Hypothyroid    Hypothyroidism 09/05/2012   Iatrogenic hypothyroidism 01/18/2006   Qualifier: Diagnosis of  By: Madilyn Fireman MD, Barnetta Chapel     Lower leg edema 08/04/2010   On hctz for this(not for blood pressure).     Palpitations 09/16/2009   Qualifier: Diagnosis of  By: Madilyn Fireman MD, Zettie Pho, HX OF 01/18/2006   Qualifier: Diagnosis of  By: Madilyn Fireman MD, Catherine     Type II or unspecified type diabetes mellitus without mention of  complication, not stated as uncontrolled 01/17/2013    Past Surgical History:  Procedure Laterality Date   CARDIAC CATHETERIZATION  2001   30% blockage   CHOLECYSTECTOMY  2003   LAP-BAND surgery  April 2007   ROBOTIC ASSISTED LAPAROSCOPIC HYSTERECTOMY AND SALPINGECTOMY  2014   TUBAL LIGATION  1989    There were no vitals filed for this visit.   Subjective Assessment - 07/11/21 0935     Subjective Pt states back pain is a lot better but left hip is bothering her. Pt was able to walk increased distance to get around [place].    Limitations Sitting;Standing;Walking    How long can you sit comfortably? No issues    How long can you stand comfortably? <5 min    How long can you walk comfortably? Walks when have to but does not do extra. Able to walk in grocery store if leaning on cart    Patient Stated Goals Walk and stand longer without having to look for a chair -- walk dog again                [facility] PT Assessment - 07/11/21 0001       Assessment   Medical Diagnosis M54.50,G89.29 (ICD-10-CM) - Chronic bilateral low back pain without sciatica    Referring Provider (PT) Hali Marry, MD      Strength   Right Hip  Flexion 4+/5    Right Hip Extension 3+/5    Right Hip External Rotation  3+/5    Right Hip Internal Rotation 4+/5    Right Hip ABduction 4+/5    Left Hip Flexion 4+/5    Left Hip Extension 4-/5    Left Hip External Rotation 3+/5    Left Hip Internal Rotation 4+/5    Left Hip ABduction 3+/5    Right Knee Flexion 4+/5    Right Knee Extension 5/5    Left Knee Flexion 4+/5    Left Knee Extension 5/5                           [facility] Adult PT Treatment/Exercise - 07/11/21 0001       Lumbar Exercises: Aerobic   Tread Mill 1.5 mph x 8 min      Lumbar Exercises: Standing   Other Standing Lumbar Exercises modified deadlift 2x10 with 10# KB                       Previous:    PT Long Term Goals - 07/11/21 0937        PT LONG TERM GOAL #1   Title Pt will be independent with HEP    Time 6    Period Weeks    Status New    Target Date 07/14/21      PT LONG TERM GOAL #2   Title Pt will be able to stand and walk at least 10 minutes without feeling the need to sit    Time 6    Period Weeks    Status Achieved    Target Date 07/14/21      PT LONG TERM GOAL #3   Title Pt will report >/=50% improvement in her pain    Baseline at least 50%    Time 6    Period Weeks    Status Achieved    Target Date 07/14/21      PT LONG TERM GOAL #4   Title Pt will have improved FOTO score to 51    Time 6    Period Weeks    Status New    Target Date 07/14/21      PT LONG TERM GOAL #5   Title Pt will demo at least 4+/5 strength in bilat hip abd and ext for improved lumbosacral posture by end of day    Baseline L hip 3+/5 but bilat hips increased at least 1/2 grade in MMT    Time 6    Period Weeks    Status Partially Met    Target Date 07/14/21             Revised:  PT Long Term Goals - 07/11/21 0937       PT LONG TERM GOAL #1   Title Pt will be independent with HEP    Time 2    Period Weeks    Status New    Target Date 07/25/21      PT LONG TERM GOAL #2   Title Pt will be able to stand and walk at least 10 minutes without feeling the need to sit    Time 6    Period Weeks    Status Achieved    Target Date 07/14/21      PT LONG TERM GOAL #3   Title Pt will report >/=50% improvement in her pain    Baseline at  least 50%    Time 6    Period Weeks    Status Achieved    Target Date 07/14/21      PT LONG TERM GOAL #4   Title Pt will have improved FOTO score to 51    Time 2    Period Weeks    Status Revised    Target Date 07/25/21      PT LONG TERM GOAL #5   Title Pt will demo at least 4+/5 strength in bilat hip abd and ext for improved lumbosacral posture by end of day    Baseline L hip 3+/5 but bilat hips increased at least 1/2 grade in MMT    Time 2    Period Weeks    Status Revised     Target Date 07/25/21                Plan - 07/11/21 1105     Clinical Impression Statement Pt notes she was able to amb for longer distance and has noticed improvements in her balance. Re-checked pt's LTGs. She has met or partially met her LTGs except that her L hip strength is still not as strong as the R. Would benefit from 1 more week of PT to continue to work on these deficits to fully meet her LTGs.    Personal Factors and Comorbidities Age;Time since onset of injury/illness/exacerbation;Past/Current Experience    Examination-Activity Limitations Locomotion Level;Stand    Examination-Participation Restrictions Cleaning;Community Activity;Yard Work    Rehab Potential Good    PT Frequency 2x / week    PT Duration 6 weeks    PT Treatment/Interventions ADLs/Self Care Home Management;Aquatic Therapy;Electrical Stimulation;Iontophoresis 23m/ml Dexamethasone;Moist Heat;Cryotherapy;Gait training;Stair training;Functional mobility training;Therapeutic activities;Therapeutic exercise;Neuromuscular re-education;Balance training;Manual techniques;Patient/family education;Passive range of motion;Dry needling;Taping    PT Next Visit Plan review and progress HEP. Continue to strengthen glutes, hamstrings, calves; progress to deadlifting    PT Home Exercise Plan [email or web address]    Consulted and Agree with Plan of Care Patient             Patient will benefit from skilled therapeutic intervention in order to improve the following deficits and impairments:  Difficulty walking, Abnormal gait, Pain, Decreased mobility, Decreased strength, Improper body mechanics, Postural dysfunction  Visit Diagnosis: Muscle weakness (generalized)  Difficulty in walking, not elsewhere classified  Other abnormalities of gait and mobility  Other low back pain     Problem List Patient Active Problem List   Diagnosis Date Noted   Chronic bilateral low back pain without sciatica 05/17/2021   Grief 10/18/2020    OSA (obstructive sleep apnea) 09/06/2017   Controlled type 2 diabetes mellitus without complication, without long-term current use of insulin ([place] 03/07/2017   Urge incontinence of urine 07/04/2016   Pelvic fluid collection 01/16/2013   [place] 09/06/2012   Lower leg edema 08/04/2010   KNEE PAIN, RIGHT 09/16/2009   PALPITATIONS 09/16/2009   Iatrogenic hypothyroidism 01/18/2006   DEFICIENCY, VITAMIN D NOS 01/18/2006   HYPERCHOLESTEROLEMIA 01/18/2006   OBESITY NOS 01/18/2006   IRREGULAR MENSTRUATION 01/18/2006   TACHYCARDIA, HX OF 01/18/2006    Vernadette Stutsman April MGordy Levan PT, DPT 07/11/2021, 11:10 AM  [facility] [address]Phone: [phone]  Fax:  [phone] Name: NKISSY CIELOMRN: [id number] of Birth: 4May 14, 1952

## 2021-07-14 ENCOUNTER — Ambulatory Visit: Payer: Medicare HMO | Admitting: Physical Therapy

## 2021-07-14 ENCOUNTER — Encounter: Payer: Self-pay | Admitting: Physical Therapy

## 2021-07-14 DIAGNOSIS — R262 Difficulty in walking, not elsewhere classified: Secondary | ICD-10-CM | POA: Diagnosis not present

## 2021-07-14 DIAGNOSIS — M5459 Other low back pain: Secondary | ICD-10-CM | POA: Diagnosis not present

## 2021-07-14 DIAGNOSIS — M6281 Muscle weakness (generalized): Secondary | ICD-10-CM

## 2021-07-14 DIAGNOSIS — R2689 Other abnormalities of gait and mobility: Secondary | ICD-10-CM | POA: Diagnosis not present

## 2021-07-14 NOTE — Therapy (Signed)
[facility] [place], [place], [address] Phone: [phone]   Fax:  [phone]  Physical Therapy Treatment and Discharge  Patient Details  Name: Penny Bauer MRN: [id number] Date of Birth: 04/09/1950 Referring Provider (PT): Hali Marry, MD  Rationale for Evaluation and Treatment Rehabilitation PHYSICAL THERAPY DISCHARGE SUMMARY  Visits from Start of Care: 9  Current functional level related to goals / functional outcomes: See below   Remaining deficits: See below   Education / Equipment: See below   Patient agrees to discharge. Patient goals were partially met. Patient is being discharged due to being pleased with the current functional level.   Encounter Date: 07/14/2021   PT End of Session - 07/14/21 1018     Visit Number 9    Number of Visits 12    Date for PT Re-Evaluation 07/14/21    Authorization Type [employer]    Progress Note Due on Visit 10    PT Start Time 1018    PT Stop Time 1057    PT Time Calculation (min) 39 min    Activity Tolerance Patient tolerated treatment well    Behavior During Therapy WFL for tasks assessed/performed             Past Medical History:  Diagnosis Date   Adenocarcinoma ([place])    Controlled type 2 diabetes mellitus without complication, without long-term current use of insulin ([place]) 03/07/2017   DEFICIENCY, VITAMIN D NOS 01/18/2006   Qualifier: Diagnosis of  By: Madilyn Fireman MD, Catherine     Endometrial cancer [facility])    Endometrial carcinoma ([place]) 09/06/2012   Endometrial biopsy performed on 08/27/2012. Final pathology revealed a patchy a grade 2 endometrial adenocarcinoma. She was referred to Dr. Genia Del at [facility].    HYPERCHOLESTEROLEMIA 01/18/2006   Qualifier: Diagnosis of  By: Madilyn Fireman MD, Catherine     Hyperlipidemia    Hypertension    Hypothyroid    Hypothyroidism 09/05/2012   Iatrogenic hypothyroidism 01/18/2006    Qualifier: Diagnosis of  By: Madilyn Fireman MD, Barnetta Chapel     Lower leg edema 08/04/2010   On hctz for this(not for blood pressure).     Palpitations 09/16/2009   Qualifier: Diagnosis of  By: Madilyn Fireman MD, Zettie Pho, HX OF 01/18/2006   Qualifier: Diagnosis of  By: Madilyn Fireman MD, Catherine     Type II or unspecified type diabetes mellitus without mention of complication, not stated as uncontrolled 01/17/2013    Past Surgical History:  Procedure Laterality Date   CARDIAC CATHETERIZATION  2001   30% blockage   CHOLECYSTECTOMY  2003   LAP-BAND surgery  April 2007   ROBOTIC ASSISTED LAPAROSCOPIC HYSTERECTOMY AND SALPINGECTOMY  2014   TUBAL LIGATION  1989    There were no vitals filed for this visit.   Subjective Assessment - 07/14/21 1020     Subjective Pt reports she helped her daughter a lot yesterday getting her scooter in/out of the car. Feels worn out today. States she thinks today will be her last day -- next 2 weeks will be too busy to come for therapy.    Limitations Sitting;Standing;Walking    How long can you sit comfortably? No issues    How long can you stand comfortably? <5 min    How long can you walk comfortably? Walks when have to but does not do extra. Able to walk in grocery store if leaning on cart    Patient Stated Goals  Walk and stand longer without having to look for a chair -- walk dog again    Currently in Pain? Yes    Pain Score 2     Pain Location Hip    Pain Orientation Left                [facility] PT Assessment - 07/14/21 0001       Assessment   Medical Diagnosis M54.50,G89.29 (ICD-10-CM) - Chronic bilateral low back pain without sciatica    Referring Provider (PT) Hali Marry, MD                           [facility] Adult PT Treatment/Exercise - 07/14/21 0001       Lumbar Exercises: Aerobic   Tread Mill 1.5 mph x 10 min      Lumbar Exercises: Standing   Other Standing Lumbar Exercises squat with red tband 2x10;  fire hydrants 2x10    Other Standing Lumbar Exercises modified deadlift 2x10 with 10# KB      Lumbar Exercises: Seated   Other Seated Lumbar Exercises Seated IR & ER with red tband 2x10 each                          PT Long Term Goals - 07/14/21 1103       PT LONG TERM GOAL #1   Title Pt will be independent with HEP    Time 2    Period Weeks    Status Achieved    Target Date 07/25/21      PT LONG TERM GOAL #2   Title Pt will be able to stand and walk at least 10 minutes without feeling the need to sit    Time 6    Period Weeks    Status Achieved    Target Date 07/14/21      PT LONG TERM GOAL #3   Title Pt will report >/=50% improvement in her pain    Baseline at least 50%    Time 6    Period Weeks    Status Achieved    Target Date 07/14/21      PT LONG TERM GOAL #4   Title Pt will have improved FOTO score to 51    Time 2    Period Weeks    Status Revised    Target Date 07/25/21      PT LONG TERM GOAL #5   Title Pt will demo at least 4+/5 strength in bilat hip abd and ext for improved lumbosacral posture by end of day    Baseline L hip 3+/5 but bilat hips increased at least 1/2 grade in MMT    Time 2    Period Weeks    Status Partially Met    Target Date 07/25/21                   Plan - 07/14/21 1101     Clinical Impression Statement Pt request for this to be her last PT visit. Session focused on reviewing HEP, lifting and adding new exercises for her to continue to work on at home. Encouraged pt to start a walking program. At this time she has met most of her goals and partially met her strength goals. She feels able to work on her strengthening at home independently.    Personal Factors and Comorbidities Age;Time since onset of injury/illness/exacerbation;Past/Current Experience  Examination-Activity Limitations Locomotion Level;Stand    Examination-Participation Restrictions Cleaning;Community Activity;Yard Work    PT  Treatment/Interventions ADLs/Self Care Home Management;Aquatic Therapy;Electrical Stimulation;Iontophoresis 74m/ml Dexamethasone;Moist Heat;Cryotherapy;Gait training;Stair training;Functional mobility training;Therapeutic activities;Therapeutic exercise;Neuromuscular re-education;Balance training;Manual techniques;Patient/family education;Passive range of motion;Dry needling;Taping    PT Next Visit Plan review and progress HEP. Continue to strengthen glutes, hamstrings, calves; progress to deadlifting    PT Home Exercise Plan [id number]    Consulted and Agree with Plan of Care Patient             Patient will benefit from skilled therapeutic intervention in order to improve the following deficits and impairments:  Difficulty walking, Abnormal gait, Pain, Decreased mobility, Decreased strength, Improper body mechanics, Postural dysfunction  Visit Diagnosis: Muscle weakness (generalized)  Difficulty in walking, not elsewhere classified  Other abnormalities of gait and mobility  Other low back pain     Problem List Patient Active Problem List   Diagnosis Date Noted   Chronic bilateral low back pain without sciatica 05/17/2021   Grief 10/18/2020   OSA (obstructive sleep apnea) 09/06/2017   Controlled type 2 diabetes mellitus without complication, without long-term current use of insulin ([place] 03/07/2017   Urge incontinence of urine 07/04/2016   Pelvic fluid collection 01/16/2013   [place]ometrial carcinoma ([place] 09/06/2012   Lower leg edema 08/04/2010   KNEE PAIN, RIGHT 09/16/2009   PALPITATIONS 09/16/2009   Iatrogenic hypothyroidism 01/18/2006   DEFICIENCY, VITAMIN D NOS 01/18/2006   HYPERCHOLESTEROLEMIA 01/18/2006   OBESITY NOS 01/18/2006   IRREGULAR MENSTRUATION 01/18/2006   TACHYCARDIA, HX OF 01/18/2006    Khalila Buechner April MGordy Levan PT, DPT 07/14/2021, 11:03 AM  [facility]  [address]Phone: [phone]  Fax:  [phone] Name: Penny KINGBIRDMRN: [id number] of Birth: 4December 31, 1952

## 2021-07-18 ENCOUNTER — Ambulatory Visit: Payer: Medicare HMO | Admitting: Family Medicine

## 2021-07-18 ENCOUNTER — Ambulatory Visit: Payer: Medicare HMO | Admitting: Rehabilitative and Restorative Service Providers"

## 2021-07-21 ENCOUNTER — Ambulatory Visit: Payer: Medicare HMO | Admitting: Family Medicine

## 2021-07-21 ENCOUNTER — Ambulatory Visit: Payer: Medicare HMO | Admitting: Rehabilitative and Restorative Service Providers"

## 2021-07-23 ENCOUNTER — Other Ambulatory Visit: Payer: Self-pay | Admitting: Family Medicine

## 2021-08-18 ENCOUNTER — Telehealth: Payer: Medicare HMO

## 2021-08-24 DIAGNOSIS — H25813 Combined forms of age-related cataract, bilateral: Secondary | ICD-10-CM | POA: Diagnosis not present

## 2021-08-24 DIAGNOSIS — H52213 Irregular astigmatism, bilateral: Secondary | ICD-10-CM | POA: Diagnosis not present

## 2021-08-24 DIAGNOSIS — E119 Type 2 diabetes mellitus without complications: Secondary | ICD-10-CM | POA: Diagnosis not present

## 2021-08-24 DIAGNOSIS — H35372 Puckering of macula, left eye: Secondary | ICD-10-CM | POA: Diagnosis not present

## 2021-08-24 DIAGNOSIS — H18513 Endothelial corneal dystrophy, bilateral: Secondary | ICD-10-CM | POA: Diagnosis not present

## 2021-08-24 LAB — HM DIABETES EYE EXAM

## 2021-08-26 DIAGNOSIS — H25813 Combined forms of age-related cataract, bilateral: Secondary | ICD-10-CM | POA: Diagnosis not present

## 2021-08-27 ENCOUNTER — Other Ambulatory Visit: Payer: Self-pay | Admitting: Family Medicine

## 2021-09-06 DIAGNOSIS — H2511 Age-related nuclear cataract, right eye: Secondary | ICD-10-CM | POA: Diagnosis not present

## 2021-09-06 DIAGNOSIS — E039 Hypothyroidism, unspecified: Secondary | ICD-10-CM | POA: Diagnosis not present

## 2021-09-06 DIAGNOSIS — Z6836 Body mass index (BMI) 36.0-36.9, adult: Secondary | ICD-10-CM | POA: Diagnosis not present

## 2021-09-06 DIAGNOSIS — H25811 Combined forms of age-related cataract, right eye: Secondary | ICD-10-CM | POA: Diagnosis not present

## 2021-09-06 DIAGNOSIS — H35372 Puckering of macula, left eye: Secondary | ICD-10-CM | POA: Diagnosis not present

## 2021-09-06 DIAGNOSIS — H2513 Age-related nuclear cataract, bilateral: Secondary | ICD-10-CM | POA: Diagnosis not present

## 2021-09-06 DIAGNOSIS — I10 Essential (primary) hypertension: Secondary | ICD-10-CM | POA: Diagnosis not present

## 2021-09-06 DIAGNOSIS — E1136 Type 2 diabetes mellitus with diabetic cataract: Secondary | ICD-10-CM | POA: Diagnosis not present

## 2021-09-06 DIAGNOSIS — E669 Obesity, unspecified: Secondary | ICD-10-CM | POA: Diagnosis not present

## 2021-09-06 DIAGNOSIS — M1990 Unspecified osteoarthritis, unspecified site: Secondary | ICD-10-CM | POA: Diagnosis not present

## 2021-09-06 DIAGNOSIS — H52213 Irregular astigmatism, bilateral: Secondary | ICD-10-CM | POA: Diagnosis not present

## 2021-09-06 DIAGNOSIS — E785 Hyperlipidemia, unspecified: Secondary | ICD-10-CM | POA: Diagnosis not present

## 2021-09-06 DIAGNOSIS — F339 Major depressive disorder, recurrent, unspecified: Secondary | ICD-10-CM | POA: Diagnosis not present

## 2021-09-06 DIAGNOSIS — H25813 Combined forms of age-related cataract, bilateral: Secondary | ICD-10-CM | POA: Diagnosis not present

## 2021-09-13 DIAGNOSIS — N3281 Overactive bladder: Secondary | ICD-10-CM | POA: Diagnosis not present

## 2021-09-13 DIAGNOSIS — H25812 Combined forms of age-related cataract, left eye: Secondary | ICD-10-CM | POA: Diagnosis not present

## 2021-09-13 DIAGNOSIS — H25813 Combined forms of age-related cataract, bilateral: Secondary | ICD-10-CM | POA: Diagnosis not present

## 2021-09-13 DIAGNOSIS — E669 Obesity, unspecified: Secondary | ICD-10-CM | POA: Diagnosis not present

## 2021-09-13 DIAGNOSIS — H2513 Age-related nuclear cataract, bilateral: Secondary | ICD-10-CM | POA: Diagnosis not present

## 2021-09-13 DIAGNOSIS — E039 Hypothyroidism, unspecified: Secondary | ICD-10-CM | POA: Diagnosis not present

## 2021-09-13 DIAGNOSIS — Z79890 Hormone replacement therapy (postmenopausal): Secondary | ICD-10-CM | POA: Diagnosis not present

## 2021-09-13 DIAGNOSIS — Z6836 Body mass index (BMI) 36.0-36.9, adult: Secondary | ICD-10-CM | POA: Diagnosis not present

## 2021-09-13 DIAGNOSIS — F339 Major depressive disorder, recurrent, unspecified: Secondary | ICD-10-CM | POA: Diagnosis not present

## 2021-09-13 DIAGNOSIS — E785 Hyperlipidemia, unspecified: Secondary | ICD-10-CM | POA: Diagnosis not present

## 2021-09-13 DIAGNOSIS — E1136 Type 2 diabetes mellitus with diabetic cataract: Secondary | ICD-10-CM | POA: Diagnosis not present

## 2021-09-20 ENCOUNTER — Emergency Department (HOSPITAL_BASED_OUTPATIENT_CLINIC_OR_DEPARTMENT_OTHER)
Admission: EM | Admit: 2021-09-20 | Discharge: 2021-09-20 | Disposition: A | Discharge status: Home / Self Care | Payer: Medicare HMO | Attending: Emergency Medicine | Admitting: Emergency Medicine

## 2021-09-20 ENCOUNTER — Encounter (HOSPITAL_BASED_OUTPATIENT_CLINIC_OR_DEPARTMENT_OTHER): Payer: Self-pay | Admitting: Pharmacy Technician

## 2021-09-20 ENCOUNTER — Other Ambulatory Visit: Payer: Self-pay

## 2021-09-20 DIAGNOSIS — R040 Epistaxis: Secondary | ICD-10-CM | POA: Insufficient documentation

## 2021-09-20 DIAGNOSIS — Z7982 Long term (current) use of aspirin: Secondary | ICD-10-CM | POA: Diagnosis not present

## 2021-09-20 MED ORDER — LIDOCAINE HCL 4 % EX SOLN
Freq: Once | CUTANEOUS | Status: AC
  Filled 2021-09-20: qty 50

## 2021-09-20 MED ORDER — SILVER NITRATE-POT NITRATE 75-25 % EX MISC
1.0000 | Freq: Once | CUTANEOUS | Status: AC
  Administered 2021-09-20: 1 via TOPICAL
  Filled 2021-09-20: qty 10

## 2021-09-20 MED ORDER — OXYMETAZOLINE HCL 0.05 % NA SOLN
1.0000 | Freq: Once | NASAL | Status: AC
  Administered 2021-09-20: 1 via NASAL
  Filled 2021-09-20: qty 30

## 2021-09-20 NOTE — ED Provider Notes (Signed)
[facility] EMERGENCY DEPT Provider Note   CSN: [id number] Arrival date & time: 09/20/21  1312     History  Chief Complaint  Patient presents with   Epistaxis    Penny Bauer is a 71 y.o. female.   Epistaxis Patient presents with nosebleed.  Has had episodes of around the last month.  Began this morning.  The right nostril all the times.  Mild to moderate bleeding.  No lightheadedness or dizziness.  No trauma.  Not on blood thinners.     Home Medications Prior to Admission medications   Medication Sig Start Date End Date Taking? Authorizing Provider  ACCU-CHEK FASTCLIX LANCETS MISC U TO TEST BLOOD SUGAR DAILY 04/20/17   [provider]  ACCU-CHEK GUIDE test strip DM E11.9 Check fasting blood sugar daily and once 2 hours after largest meal of the day as needed. 07/21/19   Hali Marry, MD  AMBULATORY NON FORMULARY MEDICATION Medication Name: CPAP set to autopap 4-18 cm water pressure with supplies and humidifier.   AHI was 12.3 and her study from 2019. 06/07/21   Hali Marry, MD  aspirin 81 MG tablet Take 81 mg by mouth daily.    [provider]  atorvastatin (LIPITOR) 20 MG tablet TAKE 1 AND 1/2 TABLETS EVERY DAY 07/25/21   Hali Marry, MD  blood glucose meter kit and supplies KIT Dispense based on patient and insurance preference. Use up to once a day as directed. (FOR ICD-9 250.00, 250.01). 03/07/17   Hali Marry, MD  celecoxib (CELEBREX) 200 MG capsule TAKE 1 CAPSULE EVERY DAY 04/26/21   Hali Marry, MD  DULoxetine (CYMBALTA) 30 MG capsule TAKE 1 CAPSULE EVERY DAY 08/29/21   Hali Marry, MD  hydrochlorothiazide (HYDRODIURIL) 25 MG tablet Take 1 tablet (25 mg total) by mouth daily. 10/18/20   Hali Marry, MD  ibuprofen (ADVIL) 800 MG tablet Take 800 mg by mouth every 8 (eight) hours as needed.    [provider]  levothyroxine (SYNTHROID) 150 MCG tablet TAKE 1 TABLET EVERY DAY  BEFORE BREAKFAST 06/10/21   Hali Marry, MD  metFORMIN (GLUCOPHAGE-XR) 500 MG 24 hr tablet Take 2 tablets (1,000 mg total) by mouth daily with breakfast. 10/18/20   Hali Marry, MD  metoprolol succinate (TOPROL-XL) 100 MG 24 hr tablet TAKE 1 TABLET EVERY DAY, TAKE WITH OR IMMEDIATELY FOLLOWING A MEAL 04/26/21   Hali Marry, MD  oxybutynin (DITROPAN) 5 MG tablet Take 1 tablet (5 mg total) by mouth 2 (two) times daily. 10/18/20   Hali Marry, MD  PFIZER-BIONT COVID-19 VAC-TRIS SUSP injection  05/09/20   [provider]      Allergies    Zocor [simvastatin]    Review of Systems   Review of Systems  HENT:  Positive for nosebleeds.     Physical Exam Updated Vital Signs BP (!) 126/58 (BP Location: Left Arm)   Pulse 63   Temp 98.5 F (36.9 C) (Oral)   Resp 20   SpO2 94%  Physical Exam Vitals and nursing note reviewed.  HENT:     Nose:     Comments: Blood in right nose medially. Cardiovascular:     Rate and Rhythm: Regular rhythm.  Skin:    General: Skin is warm.     Coloration: Skin is not pale.  Neurological:     Mental Status: She is alert and oriented to person, place, and time.     ED Results /  Procedures / Treatments   Labs (all labs ordered are listed, but only abnormal results are displayed) Labs Reviewed - No data to display  EKG None  Radiology No results found.  Procedures Procedures    Medications Ordered in ED Medications  oxymetazoline (AFRIN) 0.05 % nasal spray 1 spray (1 spray Each Nare Given by Other 09/20/21 1423)  lidocaine (XYLOCAINE) 4 % external solution ( Topical Given by Other 09/20/21 1423)  silver nitrate applicators applicator 1 Application (1 Application Topical Given by Other 09/20/21 1450)    ED Course/ Medical Decision Making/ A&P                           Medical Decision Making Risk OTC drugs. Prescription drug management.   Patient with epistaxis.  Appears to be coming from the  right side anteriorly.  Will treat with Afrin and lidocaine.  After that we will examine more closely and area able to be seen to cauterize will cauterize it.  Do not feel of any blood work at this time.  Doubt severe anemia.  Silver nitrate done.  No recurrent bleeding.  Discussed with patient about packing versus discharge with ENT follow-up.  We think ENT follow-up is reasonable.  Will discharge        Final Clinical Impression(s) / ED Diagnoses Final diagnoses:  Epistaxis    Rx / DC Orders ED Discharge Orders     None         Davonna Belling, MD 09/20/21 1526

## 2021-09-20 NOTE — ED Triage Notes (Signed)
Pt with epistaxis from R nostril approx 1 hour PTA. Pt in NAD.

## 2021-09-20 NOTE — ED Notes (Signed)
Epistaxis controlled at this time. Pt educated on not blowing nose to prevent bleeding from starting again.

## 2021-10-12 DIAGNOSIS — Z0100 Encounter for examination of eyes and vision without abnormal findings: Secondary | ICD-10-CM | POA: Diagnosis not present

## 2021-10-20 ENCOUNTER — Other Ambulatory Visit: Payer: Self-pay | Admitting: Family Medicine

## 2021-11-16 ENCOUNTER — Encounter: Payer: Self-pay | Admitting: Family Medicine

## 2021-11-16 ENCOUNTER — Ambulatory Visit (INDEPENDENT_AMBULATORY_CARE_PROVIDER_SITE_OTHER): Payer: Medicare HMO | Admitting: Family Medicine

## 2021-11-16 VITALS — BP 109/51 | HR 69 | Ht 67.0 in | Wt 232.0 lb

## 2021-11-16 DIAGNOSIS — G8929 Other chronic pain: Secondary | ICD-10-CM | POA: Diagnosis not present

## 2021-11-16 DIAGNOSIS — M2550 Pain in unspecified joint: Secondary | ICD-10-CM | POA: Diagnosis not present

## 2021-11-16 DIAGNOSIS — E7800 Pure hypercholesterolemia, unspecified: Secondary | ICD-10-CM | POA: Diagnosis not present

## 2021-11-16 DIAGNOSIS — E119 Type 2 diabetes mellitus without complications: Secondary | ICD-10-CM

## 2021-11-16 DIAGNOSIS — E032 Hypothyroidism due to medicaments and other exogenous substances: Secondary | ICD-10-CM | POA: Diagnosis not present

## 2021-11-16 DIAGNOSIS — M791 Myalgia, unspecified site: Secondary | ICD-10-CM | POA: Diagnosis not present

## 2021-11-16 DIAGNOSIS — M545 Low back pain, unspecified: Secondary | ICD-10-CM | POA: Diagnosis not present

## 2021-11-16 LAB — POCT GLYCOSYLATED HEMOGLOBIN (HGB A1C): Hemoglobin A1C: 5.9 % — AB (ref 4.0–5.6)

## 2021-11-16 MED ORDER — METOPROLOL SUCCINATE ER 100 MG PO TB24: ORAL_TABLET | ORAL | 1 refills | Status: DC

## 2021-11-16 MED ORDER — BLOOD GLUCOSE MONITOR KIT: PACK | 0 refills | Status: AC

## 2021-11-16 MED ORDER — ACCU-CHEK FASTCLIX LANCETS MISC: 4 refills | Status: AC

## 2021-11-16 MED ORDER — DULOXETINE HCL 30 MG PO CPEP: 30.0000 mg | ORAL_CAPSULE | Freq: Every day | ORAL | 1 refills | Status: DC

## 2021-11-16 MED ORDER — BLOOD GLUCOSE MONITOR KIT: PACK | 0 refills | Status: DC

## 2021-11-16 MED ORDER — CELECOXIB 200 MG PO CAPS: 200.0000 mg | ORAL_CAPSULE | Freq: Every day | ORAL | 1 refills | Status: DC

## 2021-11-16 MED ORDER — METFORMIN HCL ER 500 MG PO TB24: 500.0000 mg | ORAL_TABLET | Freq: Every day | ORAL | 0 refills | Status: DC

## 2021-11-16 MED ORDER — ACCU-CHEK GUIDE VI STRP: ORAL_STRIP | 99 refills | Status: DC

## 2021-11-16 NOTE — Patient Instructions (Addendum)
Okay to decrease metformin down to 1 tab daily. Also keep an eye on your blood pressure at home.  If most of your blood pressures are under 115, we may be able to split your metoprolol in half.  Just let me know.

## 2021-11-16 NOTE — Assessment & Plan Note (Signed)
A1c looks phenomenal today.  Will decrease metformin down to 1 tab daily.

## 2021-11-16 NOTE — Progress Notes (Signed)
Established Patient Office Visit  Subjective   Patient ID: Penny Bauer, female    DOB: 1950-03-04  Age: 71 y.o. MRN: [id number]  Chief Complaint  Patient presents with   Diabetes   Sleep Apnea    HPI  Here for 6 mo f/u:   Diabetes - no hypoglycemic events. No wounds or sores that are not healing well. No increased thirst or urination. Checking glucose at home. Taking medications as prescribed without any side effects.  She really has had a decrease in change in appetite so has not been eating as much.  In fact she is down about 11 pounds since I last saw her 6 months ago.  She still feels like she is probably eating more sweets than she probably should.  She is taking her metformin regularly.  Occasionally she will only take 1 tab instead of 2 if she feels like not really eating a whole lot.  He is a new prescription for glucometer.  F/U OSA - We can resubmit the sleep study and see if we can start with CPAP.  Her AHI was 12.3 and her study from 2019.  She never heard back on the referral that we placed 6 months ago and would like to have it sent to [place] instead.  Hypothyroidism-she has noticed a lot of hair loss recently and some peeling fingernail she is not sure if it is related but it could be.  He has been trying to do some exercise and walking she did physical therapy over the summer for her back and it really did make a difference.  Taking the Cymbalta was also really helpful and she would like to stay on the Cymbalta.  She is getting ready to travel to [place] to visit a friend for a week.    ROS    Objective:     BP (!) 109/51   Pulse 69   Ht 5' 7"  (1.702 m)   Wt 232 lb (105.2 kg)   SpO2 97%   BMI 36.34 kg/m    Physical Exam Vitals and nursing note reviewed.  Constitutional:      Appearance: She is well-developed.  HENT:     Head: Normocephalic and atraumatic.  Cardiovascular:     Rate and Rhythm: Normal rate and regular rhythm.     Heart sounds:  Normal heart sounds.  Pulmonary:     Effort: Pulmonary effort is normal.     Breath sounds: Normal breath sounds.  Skin:    General: Skin is warm and dry.  Neurological:     Mental Status: She is alert and oriented to person, place, and time.  Psychiatric:        Behavior: Behavior normal.      Results for orders placed or performed in visit on 11/16/21  POCT glycosylated hemoglobin (Hb A1C)  Result Value Ref Range   Hemoglobin A1C 5.9 (A) 4.0 - 5.6 %   HbA1c POC (<> result, manual entry)     HbA1c, POC (prediabetic range)     HbA1c, POC (controlled diabetic range)        The 10-year ASCVD risk score (Arnett DK, et al., 2019) is: 22.2%    Assessment & Plan:   Problem List Items Addressed This Visit       Endocrine   Iatrogenic hypothyroidism    And to recheck thyroid she has noticed some hair loss.  She is also lost a fair amount of weight so she might need a slight  adjustment in her thyroid regimen.      Relevant Medications   metoprolol succinate (TOPROL-XL) 100 MG 24 hr tablet   Other Relevant Orders   TSH   Controlled type 2 diabetes mellitus without complication, without long-term current use of insulin (HCC) - Primary    A1c looks phenomenal today.  Will decrease metformin down to 1 tab daily.      Relevant Medications   metFORMIN (GLUCOPHAGE-XR) 500 MG 24 hr tablet   ACCU-CHEK GUIDE test strip   Accu-Chek FastClix Lancets MISC   blood glucose meter kit and supplies KIT   Other Relevant Orders   POCT glycosylated hemoglobin (Hb A1C) (Completed)   COMPLETE METABOLIC PANEL WITH GFR   Lipid panel     Other   HYPERCHOLESTEROLEMIA   Relevant Medications   metoprolol succinate (TOPROL-XL) 100 MG 24 hr tablet   Other Relevant Orders   COMPLETE METABOLIC PANEL WITH GFR   Lipid panel   Chronic bilateral low back pain without sciatica    Been really happy with the results of the Cymbalta.  Just encouraged her to continue to do her home stretches and  exercises that she learned in physical therapy and try to walk when she can.  She would like to keep the Cymbalta for now.      Relevant Medications   celecoxib (CELEBREX) 200 MG capsule   DULoxetine (CYMBALTA) 30 MG capsule   Other Visit Diagnoses     Myalgia       Relevant Medications   celecoxib (CELEBREX) 200 MG capsule   Polyarthralgia       Relevant Medications   celecoxib (CELEBREX) 200 MG capsule       Return in about 4 months (around 03/19/2022) for Diabetes follow-up.    [name], MD

## 2021-11-16 NOTE — Progress Notes (Signed)
Pt reports that she was contacted by [employer] and was told that she didn't "qualify"she wasn't sure if this was due to her insurance or if it was because she needed another sleep study. She admits that she just didn't follow up on this until now.

## 2021-11-16 NOTE — Assessment & Plan Note (Signed)
Been really happy with the results of the Cymbalta.  Just encouraged her to continue to do her home stretches and exercises that she learned in physical therapy and try to walk when she can.  She would like to keep the Cymbalta for now.

## 2021-11-16 NOTE — Assessment & Plan Note (Signed)
And to recheck thyroid she has noticed some hair loss.  She is also lost a fair amount of weight so she might need a slight adjustment in her thyroid regimen.

## 2021-12-30 ENCOUNTER — Encounter: Payer: Self-pay | Admitting: Family Medicine

## 2022-01-02 ENCOUNTER — Other Ambulatory Visit: Payer: Self-pay | Admitting: *Deleted

## 2022-01-02 DIAGNOSIS — G4733 Obstructive sleep apnea (adult) (pediatric): Secondary | ICD-10-CM

## 2022-01-02 MED ORDER — AMBULATORY NON FORMULARY MEDICATION: 0 refills | Status: DC

## 2022-01-03 ENCOUNTER — Other Ambulatory Visit: Payer: Self-pay | Admitting: *Deleted

## 2022-01-03 DIAGNOSIS — G4733 Obstructive sleep apnea (adult) (pediatric): Secondary | ICD-10-CM

## 2022-01-03 MED ORDER — AMBULATORY NON FORMULARY MEDICATION: 0 refills | Status: DC

## 2022-02-23 ENCOUNTER — Other Ambulatory Visit: Payer: Self-pay | Admitting: Family Medicine

## 2022-02-23 DIAGNOSIS — E119 Type 2 diabetes mellitus without complications: Secondary | ICD-10-CM

## 2022-03-21 ENCOUNTER — Ambulatory Visit: Payer: Medicare HMO | Admitting: Family Medicine

## 2022-03-22 DIAGNOSIS — E7800 Pure hypercholesterolemia, unspecified: Secondary | ICD-10-CM | POA: Diagnosis not present

## 2022-03-22 DIAGNOSIS — E119 Type 2 diabetes mellitus without complications: Secondary | ICD-10-CM | POA: Diagnosis not present

## 2022-03-22 DIAGNOSIS — E032 Hypothyroidism due to medicaments and other exogenous substances: Secondary | ICD-10-CM | POA: Diagnosis not present

## 2022-03-23 LAB — LIPID PANEL
Cholesterol: 171 mg/dL (ref <200)
HDL: 46 mg/dL — ABNORMAL LOW (ref >=50)
LDL Cholesterol (Calc): 101 mg/dL (calc) — ABNORMAL HIGH
Non-HDL Cholesterol (Calc): 125 mg/dL (calc) (ref <130)
Total CHOL/HDL Ratio: 3.7 (calc) (ref <5.0)
Triglycerides: 140 mg/dL (ref <150)

## 2022-03-23 LAB — COMPLETE METABOLIC PANEL WITH GFR
AG Ratio: 1.2 (calc) (ref 1.0–2.5)
ALT: 9 U/L (ref 6–29)
AST: 10 U/L (ref 10–35)
Albumin: 3.9 g/dL (ref 3.6–5.1)
Alkaline phosphatase (APISO): 108 U/L (ref 37–153)
BUN: 20 mg/dL (ref 7–25)
CO2: 35 mmol/L — ABNORMAL HIGH (ref 20–32)
Calcium: 9.9 mg/dL (ref 8.6–10.4)
Chloride: 101 mmol/L (ref 98–110)
Creat: 0.63 mg/dL (ref 0.60–1.00)
Globulin: 3.3 g/dL (calc) (ref 1.9–3.7)
Glucose, Bld: 122 mg/dL — ABNORMAL HIGH (ref 65–99)
Potassium: 4.4 mmol/L (ref 3.5–5.3)
Sodium: 142 mmol/L (ref 135–146)
Total Bilirubin: 0.6 mg/dL (ref 0.2–1.2)
Total Protein: 7.2 g/dL (ref 6.1–8.1)
eGFR: 94 mL/min/{1.73_m2} (ref >=60)

## 2022-03-23 LAB — TSH: TSH: 0.18 mIU/L — ABNORMAL LOW (ref 0.40–4.50)

## 2022-03-23 NOTE — Progress Notes (Signed)
Hi Penny Bauer, the metabolic panel is okay.  LDL cholesterol has crept up about 20+ points.  We can discuss possibly adjusting the Lipitor when I see you back.  I am not sure if maybe you have run out, or if diet and exercise has changed recently but we need to get that LDL back under 100.  Thyroid looks a little overly suppressed.  Please let me know if you are still taking 1 whole tab daily of the levothyroxine.  Also a reminder it looks like you are due for your mammogram I did not see one in the system for 2023 unless you are going somewhere new.

## 2022-03-27 ENCOUNTER — Ambulatory Visit (INDEPENDENT_AMBULATORY_CARE_PROVIDER_SITE_OTHER): Payer: Medicare HMO | Admitting: Family Medicine

## 2022-03-27 ENCOUNTER — Ambulatory Visit (INDEPENDENT_AMBULATORY_CARE_PROVIDER_SITE_OTHER): Payer: Medicare HMO

## 2022-03-27 ENCOUNTER — Encounter: Payer: Self-pay | Admitting: Family Medicine

## 2022-03-27 VITALS — BP 120/50 | HR 70 | Resp 20 | Ht 67.0 in | Wt 232.1 lb

## 2022-03-27 DIAGNOSIS — R519 Headache, unspecified: Secondary | ICD-10-CM

## 2022-03-27 DIAGNOSIS — M542 Cervicalgia: Secondary | ICD-10-CM | POA: Diagnosis not present

## 2022-03-27 DIAGNOSIS — G8929 Other chronic pain: Secondary | ICD-10-CM

## 2022-03-27 DIAGNOSIS — M545 Low back pain, unspecified: Secondary | ICD-10-CM | POA: Diagnosis not present

## 2022-03-27 DIAGNOSIS — F4321 Adjustment disorder with depressed mood: Secondary | ICD-10-CM | POA: Diagnosis not present

## 2022-03-27 DIAGNOSIS — G4733 Obstructive sleep apnea (adult) (pediatric): Secondary | ICD-10-CM

## 2022-03-27 DIAGNOSIS — E032 Hypothyroidism due to medicaments and other exogenous substances: Secondary | ICD-10-CM

## 2022-03-27 DIAGNOSIS — E119 Type 2 diabetes mellitus without complications: Secondary | ICD-10-CM | POA: Diagnosis not present

## 2022-03-27 LAB — POCT GLYCOSYLATED HEMOGLOBIN (HGB A1C): Hemoglobin A1C: 6.3 % — AB (ref 4.0–5.6)

## 2022-03-27 MED ORDER — DULOXETINE HCL 60 MG PO CPEP: 60.0000 mg | ORAL_CAPSULE | Freq: Every day | ORAL | 1 refills | Status: DC

## 2022-03-27 MED ORDER — LEVOTHYROXINE SODIUM 150 MCG PO TABS: ORAL_TABLET | ORAL | 1 refills | Status: DC

## 2022-03-27 MED ORDER — METOPROLOL SUCCINATE ER 100 MG PO TB24: ORAL_TABLET | ORAL | 3 refills | Status: DC

## 2022-03-27 MED ORDER — PREDNISONE 20 MG PO TABS: 40.0000 mg | ORAL_TABLET | Freq: Every day | ORAL | 0 refills | Status: DC

## 2022-03-27 NOTE — Assessment & Plan Note (Signed)
She is still not actively treating her sleep apnea and CO2 levels are mildly elevated

## 2022-03-27 NOTE — Assessment & Plan Note (Signed)
A1c is up to 6.3 today was 5.9 last time so it still good but it is trending upward.  Just encouraged her to be consistent with taking the medication and continue to work on healthy food choices and regular exercise.

## 2022-03-27 NOTE — Patient Instructions (Signed)
Please schedule your mammogram when you get a chance. Let me know if the prednisone was helpful or not helpful. I did increase her Cymbalta to 60 mg and sent it to your mail order.

## 2022-03-27 NOTE — Progress Notes (Unsigned)
Established Patient Office Visit  Subjective   Patient ID: Penny Bauer, female    DOB: 12/07/1950  Age: 72 y.o. MRN: [id number]  Chief Complaint  Patient presents with   Diabetes    HPI   Diabetes - no hypoglycemic events. No wounds or sores that are not healing well. No increased thirst or urination. Checking glucose at home. Taking medications as prescribed without any side effects.  He is still really struggling with her grief.  She just feels like it is difficult to get motivated some days.  She has had some URI symptoms and recently started a new eye vitamin.  Complains of headaches on the right side of her head that is been there for about 3 months.  She says it is constant.  And now she starting it some pain on the right side of her neck as well.    {History (Optional):23778}  ROS    Objective:     BP (!) 120/50   Pulse 70   Resp 20   Ht '5\' 7"'$  (1.702 m)   Wt 232 lb 1.9 oz (105.3 kg)   SpO2 95%   BMI 36.36 kg/m  {Vitals History (Optional):23777}  Physical Exam Vitals and nursing note reviewed.  Constitutional:      Appearance: She is well-developed.  HENT:     Head: Normocephalic and atraumatic.  Cardiovascular:     Rate and Rhythm: Normal rate and regular rhythm.     Heart sounds: Normal heart sounds.  Pulmonary:     Effort: Pulmonary effort is normal.     Breath sounds: Normal breath sounds.  Skin:    General: Skin is warm and dry.  Neurological:     Mental Status: She is alert and oriented to person, place, and time.  Psychiatric:        Behavior: Behavior normal.      Results for orders placed or performed in visit on 03/27/22  POCT glycosylated hemoglobin (Hb A1C)  Result Value Ref Range   Hemoglobin A1C 6.3 (A) 4.0 - 5.6 %   HbA1c POC (<> result, manual entry)     HbA1c, POC (prediabetic range)     HbA1c, POC (controlled diabetic range)      {Labs (Optional):23779}  The 10-year ASCVD risk score (Arnett DK, et al., 2019) is:  27.1%    Assessment & Plan:   Problem List Items Addressed This Visit       Respiratory   OSA (obstructive sleep apnea)    She is still not actively treating her sleep apnea and CO2 levels are mildly elevated         Endocrine   Iatrogenic hypothyroidism   Relevant Medications   levothyroxine (SYNTHROID) 150 MCG tablet   metoprolol succinate (TOPROL-XL) 100 MG 24 hr tablet   Controlled type 2 diabetes mellitus without complication, without long-term current use of insulin (HCC) - Primary    A1c is up to 6.3 today was 5.9 last time so it still good but it is trending upward.  Just encouraged her to be consistent with taking the medication and continue to work on healthy food choices and regular exercise.      Relevant Orders   POCT glycosylated hemoglobin (Hb A1C) (Completed)     Other   Grief    Gust the possibility of referral to therapist.  She is not sure about it.  Just encouraged her to think about it and let me know.  Or also could try increasing  the Cymbalta to 60 mg she takes it for her low back pain but I want to see if it might be helpful for her mood as well.      Chronic bilateral low back pain without sciatica   Relevant Medications   DULoxetine (CYMBALTA) 60 MG capsule   predniSONE (DELTASONE) 20 MG tablet   Other Visit Diagnoses     Neck pain       Relevant Medications   predniSONE (DELTASONE) 20 MG tablet   Other Relevant Orders   DG Cervical Spine Complete   Chronic intractable headache, unspecified headache type       Relevant Medications   DULoxetine (CYMBALTA) 60 MG capsule   metoprolol succinate (TOPROL-XL) 100 MG 24 hr tablet   predniSONE (DELTASONE) 20 MG tablet   Other Relevant Orders   DG Cervical Spine Complete      Went over recent labs that she had done this month with her today.    Return in about 3 months (around 06/25/2022) for Diabetes follow-up and Mood .    [name], MD

## 2022-03-27 NOTE — Assessment & Plan Note (Addendum)
Gust the possibility of referral to therapist.  She is not sure about it.  Just encouraged her to think about it and let me know.  Or also could try increasing the Cymbalta to 60 mg she takes it for her low back pain but I want to see if it might be helpful for her mood as well.

## 2022-03-29 ENCOUNTER — Other Ambulatory Visit: Payer: Self-pay | Admitting: Family Medicine

## 2022-03-29 DIAGNOSIS — M542 Cervicalgia: Secondary | ICD-10-CM

## 2022-03-29 DIAGNOSIS — G8929 Other chronic pain: Secondary | ICD-10-CM

## 2022-03-29 NOTE — Progress Notes (Signed)
Hi Penny Bauer.,  Your xray of your ned shows moderate to severe arthritis at several levels,  and a degenerative disc at C4-5.   You would benefit greatly from physical therapy.  Would you like me to refer you.

## 2022-03-29 NOTE — Progress Notes (Signed)
Rf placed for PT

## 2022-03-29 NOTE — Progress Notes (Signed)
Orders Placed This Encounter  Procedures   Ambulatory referral to Physical Therapy    Referral Priority:   Routine    Referral Type:   Physical Medicine    Referral Reason:   Specialty Services Required    Requested Specialty:   Physical Therapy    Number of Visits Requested:   1    

## 2022-04-17 DIAGNOSIS — Z1231 Encounter for screening mammogram for malignant neoplasm of breast: Secondary | ICD-10-CM | POA: Diagnosis not present

## 2022-04-17 DIAGNOSIS — R92323 Mammographic fibroglandular density, bilateral breasts: Secondary | ICD-10-CM | POA: Diagnosis not present

## 2022-04-17 LAB — HM MAMMOGRAPHY

## 2022-04-19 ENCOUNTER — Encounter: Payer: Self-pay | Admitting: Family Medicine

## 2022-05-04 DIAGNOSIS — M542 Cervicalgia: Secondary | ICD-10-CM | POA: Diagnosis not present

## 2022-05-06 DIAGNOSIS — N390 Urinary tract infection, site not specified: Secondary | ICD-10-CM | POA: Diagnosis not present

## 2022-05-09 DIAGNOSIS — M542 Cervicalgia: Secondary | ICD-10-CM | POA: Diagnosis not present

## 2022-05-11 DIAGNOSIS — M542 Cervicalgia: Secondary | ICD-10-CM | POA: Diagnosis not present

## 2022-05-17 DIAGNOSIS — M542 Cervicalgia: Secondary | ICD-10-CM | POA: Diagnosis not present

## 2022-05-19 DIAGNOSIS — M542 Cervicalgia: Secondary | ICD-10-CM | POA: Diagnosis not present

## 2022-05-20 ENCOUNTER — Other Ambulatory Visit: Payer: Self-pay | Admitting: Family Medicine

## 2022-05-20 DIAGNOSIS — M2550 Pain in unspecified joint: Secondary | ICD-10-CM

## 2022-05-20 DIAGNOSIS — M791 Myalgia, unspecified site: Secondary | ICD-10-CM

## 2022-05-23 DIAGNOSIS — M542 Cervicalgia: Secondary | ICD-10-CM | POA: Diagnosis not present

## 2022-05-25 DIAGNOSIS — M542 Cervicalgia: Secondary | ICD-10-CM | POA: Diagnosis not present

## 2022-05-29 DIAGNOSIS — M542 Cervicalgia: Secondary | ICD-10-CM | POA: Diagnosis not present

## 2022-06-08 ENCOUNTER — Telehealth: Payer: Self-pay | Admitting: Family Medicine

## 2022-06-08 NOTE — Telephone Encounter (Signed)
Called patient to schedule Medicare Annual Wellness Visit (AWV). Left message for patient to call back and schedule Medicare Annual Wellness Visit (AWV).  Last date of AWV: 2022  Please schedule an appointment at any time with [facility].  If any questions, please contact me at [phone].  Thank you ,  Morgan Jessup Patient Access Advocate II Direct Dial: [phone]   

## 2022-06-27 ENCOUNTER — Encounter: Payer: Self-pay | Admitting: Family Medicine

## 2022-06-27 ENCOUNTER — Ambulatory Visit (INDEPENDENT_AMBULATORY_CARE_PROVIDER_SITE_OTHER): Payer: Medicare HMO | Admitting: Family Medicine

## 2022-06-27 VITALS — BP 116/46 | HR 61 | Ht 67.0 in | Wt 232.0 lb

## 2022-06-27 DIAGNOSIS — E119 Type 2 diabetes mellitus without complications: Secondary | ICD-10-CM

## 2022-06-27 DIAGNOSIS — F320 Major depressive disorder, single episode, mild: Secondary | ICD-10-CM | POA: Diagnosis not present

## 2022-06-27 DIAGNOSIS — G4733 Obstructive sleep apnea (adult) (pediatric): Secondary | ICD-10-CM

## 2022-06-27 DIAGNOSIS — Z7984 Long term (current) use of oral hypoglycemic drugs: Secondary | ICD-10-CM | POA: Diagnosis not present

## 2022-06-27 LAB — POCT GLYCOSYLATED HEMOGLOBIN (HGB A1C): Hemoglobin A1C: 6.2 % — AB (ref 4.0–5.6)

## 2022-06-27 LAB — POCT UA - MICROALBUMIN
Albumin/Creatinine Ratio, Urine, POC: <30
Creatinine, POC: 300 mg/dL
Microalbumin Ur, POC: 80 mg/L

## 2022-06-27 MED ORDER — METFORMIN HCL ER 500 MG PO TB24: 500.0000 mg | ORAL_TABLET | Freq: Every day | ORAL | 3 refills | Status: DC

## 2022-06-27 MED ORDER — AMBULATORY NON FORMULARY MEDICATION: 99 refills | Status: DC

## 2022-06-27 NOTE — Progress Notes (Signed)
Established Patient Office Visit  Subjective   Patient ID: Penny Bauer, female    DOB: [birth year]  Age: 72 y.o. MRN: [id number]  Chief Complaint  Patient presents with   Diabetes    HPI   Diabetes - no hypoglycemic events. No wounds or sores that are not healing well. No increased thirst or urination. Checking glucose at home. Taking medications as prescribed without any side effects.  F/U depression - she feels she is doing Ok on the Cymbalta. Still struggling with grief.  She is not actually seeing a therapist.    She would like me to look in her right ear. Feels full. No pain.  Has been present for several weeks.      ROS    Objective:     BP (!) 116/46   Pulse 61   Ht 5\' 7"  (1.702 m)   Wt 232 lb (105.2 kg)   SpO2 97%   BMI 36.34 kg/m    Physical Exam Vitals and nursing note reviewed.  Constitutional:      Appearance: She is well-developed.  HENT:     Head: Normocephalic and atraumatic.  Cardiovascular:     Rate and Rhythm: Normal rate and regular rhythm.     Heart sounds: Normal heart sounds.  Pulmonary:     Effort: Pulmonary effort is normal.     Breath sounds: Normal breath sounds.  Skin:    General: Skin is warm and dry.  Neurological:     Mental Status: She is alert and oriented to person, place, and time.  Psychiatric:        Behavior: Behavior normal.      Results for orders placed or performed in visit on 06/27/22  POCT UA - Microalbumin  Result Value Ref Range   Microalbumin Ur, POC 80 mg/L   Creatinine, POC 300 mg/dL   Albumin/Creatinine Ratio, Urine, POC <30   POCT glycosylated hemoglobin (Hb A1C)  Result Value Ref Range   Hemoglobin A1C 6.2 (A) 4.0 - 5.6 %   HbA1c POC (<> result, manual entry)     HbA1c, POC (prediabetic range)     HbA1c, POC (controlled diabetic range)        The 10-year ASCVD risk score (Arnett DK, et al., 2019) is: 28.1%    Assessment & Plan:   Problem List Items Addressed This Visit        Respiratory   OSA (obstructive sleep apnea)     Didn't do well with the oral appliance so would like to go back to the CPAP.       Relevant Medications   AMBULATORY NON FORMULARY MEDICATION     Endocrine   Controlled type 2 diabetes mellitus without complication, without long-term current use of insulin (HCC) - Primary    A1c looks great today at 6.2.  Continue work on [employer] and regular exercise continue with metformin.  She did have some concerns about the metformin but we did discuss those today.      Relevant Medications   metFORMIN (GLUCOPHAGE-XR) 500 MG 24 hr tablet   Other Relevant Orders   POCT UA - Microalbumin (Completed)   POCT glycosylated hemoglobin (Hb A1C) (Completed)     Other   Depression, major, single episode, mild (HCC)    Stable on 60 mg of Cymbalta.  I do think she would benefit from therapy/counseling and encouraged her to call and let me know if it something she would be open to her interested in.  Return in about 4 months (around 10/28/2022) for Diabetes follow-up.    Nani Gasser, MD

## 2022-06-27 NOTE — Assessment & Plan Note (Signed)
Didn't do well with the oral appliance so would like to go back to the CPAP.

## 2022-06-27 NOTE — Assessment & Plan Note (Signed)
Stable on 60 mg of Cymbalta.  I do think she would benefit from therapy/counseling and encouraged her to call and let me know if it something she would be open to her interested in.

## 2022-06-27 NOTE — Assessment & Plan Note (Signed)
A1c looks great today at 6.2.  Continue work on [employer] and regular exercise continue with metformin.  She did have some concerns about the metformin but we did discuss those today.

## 2022-07-20 ENCOUNTER — Other Ambulatory Visit: Payer: Self-pay | Admitting: *Deleted

## 2022-07-20 DIAGNOSIS — G4733 Obstructive sleep apnea (adult) (pediatric): Secondary | ICD-10-CM

## 2022-07-20 MED ORDER — AMBULATORY NON FORMULARY MEDICATION: 99 refills | Status: AC

## 2022-07-31 ENCOUNTER — Other Ambulatory Visit: Payer: Self-pay | Admitting: Family Medicine

## 2022-08-10 DIAGNOSIS — H18513 Endothelial corneal dystrophy, bilateral: Secondary | ICD-10-CM | POA: Diagnosis not present

## 2022-08-10 DIAGNOSIS — H43811 Vitreous degeneration, right eye: Secondary | ICD-10-CM | POA: Diagnosis not present

## 2022-08-10 DIAGNOSIS — H43392 Other vitreous opacities, left eye: Secondary | ICD-10-CM | POA: Diagnosis not present

## 2022-08-10 DIAGNOSIS — E119 Type 2 diabetes mellitus without complications: Secondary | ICD-10-CM | POA: Diagnosis not present

## 2022-08-10 LAB — HM DIABETES EYE EXAM

## 2022-09-01 ENCOUNTER — Encounter: Payer: Self-pay | Admitting: Family Medicine

## 2022-09-13 ENCOUNTER — Other Ambulatory Visit: Payer: Self-pay | Admitting: Family Medicine

## 2022-09-13 DIAGNOSIS — M545 Low back pain, unspecified: Secondary | ICD-10-CM

## 2022-10-30 ENCOUNTER — Ambulatory Visit: Payer: Medicare HMO | Admitting: Family Medicine

## 2022-11-28 ENCOUNTER — Telehealth: Payer: Self-pay | Admitting: Family Medicine

## 2022-11-28 ENCOUNTER — Encounter: Payer: Self-pay | Admitting: Family Medicine

## 2022-11-28 ENCOUNTER — Ambulatory Visit (INDEPENDENT_AMBULATORY_CARE_PROVIDER_SITE_OTHER): Payer: Medicare HMO | Admitting: Family Medicine

## 2022-11-28 VITALS — BP 117/44 | HR 66 | Ht 67.0 in | Wt 235.0 lb

## 2022-11-28 DIAGNOSIS — Z6835 Body mass index (BMI) 35.0-35.9, adult: Secondary | ICD-10-CM

## 2022-11-28 DIAGNOSIS — Z23 Encounter for immunization: Secondary | ICD-10-CM

## 2022-11-28 DIAGNOSIS — F320 Major depressive disorder, single episode, mild: Secondary | ICD-10-CM | POA: Diagnosis not present

## 2022-11-28 DIAGNOSIS — E119 Type 2 diabetes mellitus without complications: Secondary | ICD-10-CM | POA: Diagnosis not present

## 2022-11-28 DIAGNOSIS — G4733 Obstructive sleep apnea (adult) (pediatric): Secondary | ICD-10-CM

## 2022-11-28 DIAGNOSIS — E7800 Pure hypercholesterolemia, unspecified: Secondary | ICD-10-CM

## 2022-11-28 LAB — POCT GLYCOSYLATED HEMOGLOBIN (HGB A1C): Hemoglobin A1C: 5.9 % — AB (ref 4.0–5.6)

## 2022-11-28 MED ORDER — TIRZEPATIDE 2.5 MG/0.5ML ~~LOC~~ SOAJ: 2.5000 mg | SUBCUTANEOUS | 0 refills | Status: DC

## 2022-11-28 NOTE — Assessment & Plan Note (Signed)
We will see if we can get a GLP1 covered.

## 2022-11-28 NOTE — Telephone Encounter (Signed)
Patient will be new start Mounjaro.  Needs prior authorization she does have a history of diabetes, obstructive sleep apnea, hyperlipidemia, lower extremity joint pain, and prior history of gastric bypass.

## 2022-11-28 NOTE — Progress Notes (Signed)
Established Patient Office Visit  Subjective   Patient ID: Penny Bauer, female    DOB: [birth year]  Age: 72 y.o. MRN: [id number]  Chief Complaint  Patient presents with   Diabetes   Hypertension    HPI  Diabetes - no hypoglycemic events. No wounds or sores that are not healing well. No increased thirst or urination. Checking glucose at home. Taking medications as prescribed without any side effects.  Follow-up obstructive sleep apnea-she does have the app on her phone for [place] to review but is also interested in inspire.  She has been wearing her CPAP very consistently and does feel like it is helpful but just really really does not like wearing it she is interested in alternatives.  She is actually tried an oral appliance right around the time that COVID started and just did not find that it was useful she did not feel like she could get her tongue in a comfortable position with a so she went back to CPAP.    ROS    Objective:     BP (!) 117/44   Pulse 66   Ht 5\' 7"  (1.702 m)   Wt 235 lb (106.6 kg)   SpO2 94%   BMI 36.81 kg/m    Physical Exam Vitals and nursing note reviewed.  Constitutional:      Appearance: Normal appearance.  HENT:     Head: Normocephalic and atraumatic.  Eyes:     Conjunctiva/sclera: Conjunctivae normal.  Cardiovascular:     Rate and Rhythm: Normal rate and regular rhythm.  Pulmonary:     Effort: Pulmonary effort is normal.     Breath sounds: Normal breath sounds.  Skin:    General: Skin is warm and dry.  Neurological:     Mental Status: She is alert.  Psychiatric:        Mood and Affect: Mood normal.      Results for orders placed or performed in visit on 11/28/22  POCT HgB A1C  Result Value Ref Range   Hemoglobin A1C 5.9 (A) 4.0 - 5.6 %   HbA1c POC (<> result, manual entry)     HbA1c, POC (prediabetic range)     HbA1c, POC (controlled diabetic range)        The 10-year ASCVD risk score (Arnett DK, et al., 2019) is:  28.5%    Assessment & Plan:   Problem List Items Addressed This Visit       Respiratory   OSA (obstructive sleep apnea)    Currently on CPAP.  I did give her some additional information about the inspire device.  If she decides it something that she is interested in happy to refer her for consultation to see if she might be a good candidate for it.        Endocrine   Controlled type 2 diabetes mellitus without complication, without long-term current use of insulin (HCC) - Primary    A1c looks good today but she would really like to consider getting on a GLP-1 to simplify her regimen and also to help with weight loss she just really struggles with craving sweets.  Her weight is up about 3 pounds since I last saw her.  She had actually lost about 8 but then gained it back plus more.  She also has a history of gastric banding.  She has a history of obstructive sleep apnea, lower extremity joint pain, obesity with comorbidity with BMI of 36, and hyperlipidemia.  Relevant Medications   tirzepatide (MOUNJARO) 2.5 MG/0.5ML Pen   Other Relevant Orders   POCT HgB A1C (Completed)   BMP8+EGFR     Other   Severe obesity (BMI 35.0-35.9 with comorbidity) (HCC)    We will see if we can get a GLP1 covered.        Relevant Medications   tirzepatide (MOUNJARO) 2.5 MG/0.5ML Pen   Other Relevant Orders   BMP8+EGFR   Direct LDL   HYPERCHOLESTEROLEMIA   Relevant Orders   Direct LDL   Depression, major, single episode, mild (HCC)    Doing well with cymbalta.  Continue currente regimen.       06/27/2022    2:19 PM 03/27/2022    2:23 PM 11/16/2021   11:47 AM  PHQ9 SCORE ONLY  PHQ-9 Total Score 6 4 6          Other Visit Diagnoses     Encounter for immunization       Relevant Orders   Flu Vaccine Trivalent High Dose (Fluad) (Completed)   Pfizer Comirnaty Covid-19 Vaccine 5yrs & older (Completed)       No follow-ups on file.    Nani Gasser, MD

## 2022-11-28 NOTE — Assessment & Plan Note (Signed)
A1c looks good today but she would really like to consider getting on a GLP-1 to simplify her regimen and also to help with weight loss she just really struggles with craving sweets.  Her weight is up about 3 pounds since I last saw her.  She had actually lost about 8 but then gained it back plus more.  She also has a history of gastric banding.  She has a history of obstructive sleep apnea, lower extremity joint pain, obesity with comorbidity with BMI of 36, and hyperlipidemia.

## 2022-11-28 NOTE — Assessment & Plan Note (Signed)
Doing well with cymbalta.  Continue currente regimen.       06/27/2022    2:19 PM 03/27/2022    2:23 PM 11/16/2021   11:47 AM  PHQ9 SCORE ONLY  PHQ-9 Total Score 6 4 6

## 2022-11-28 NOTE — Assessment & Plan Note (Signed)
Currently on CPAP.  I did give her some additional information about the inspire device.  If she decides it something that she is interested in happy to refer her for consultation to see if she might be a good candidate for it.

## 2022-11-29 LAB — BMP8+EGFR
BUN/Creatinine Ratio: 21 (ref 12–28)
BUN: 17 mg/dL (ref 8–27)
CO2: 26 mmol/L (ref 20–29)
Calcium: 9.7 mg/dL (ref 8.7–10.3)
Chloride: 99 mmol/L (ref 96–106)
Creatinine, Ser: 0.81 mg/dL (ref 0.57–1.00)
Glucose: 112 mg/dL — ABNORMAL HIGH (ref 70–99)
Potassium: 4.2 mmol/L (ref 3.5–5.2)
Sodium: 140 mmol/L (ref 134–144)
eGFR: 76 mL/min/{1.73_m2} (ref >59)

## 2022-11-29 LAB — LDL CHOLESTEROL, DIRECT: LDL Direct: 82 mg/dL (ref 0–99)

## 2022-11-29 NOTE — Progress Notes (Signed)
Hi Witney, metabolic panel looks good.  I did go ahead and recheck your direct LDL just to see if it was under 100 this time and it is which is great.  Continue current regimen.  We also love to have you schedule your Medicare wellness visit with our Medicare wellness nurse.  It can be virtual or over the phone if you would prefer

## 2022-11-30 ENCOUNTER — Telehealth: Payer: Self-pay

## 2022-11-30 NOTE — Telephone Encounter (Signed)
Initiated Prior authorization VWU:[id number] 2.5MG /0.5ML auto-injectors  Via: Covermymeds Case/Key:[id number] Status: approved  as of 11/30/22 Reason:Available without authorization.   Notified Pt via: Mychart

## 2022-12-14 ENCOUNTER — Other Ambulatory Visit: Payer: Self-pay | Admitting: Family Medicine

## 2022-12-14 DIAGNOSIS — E032 Hypothyroidism due to medicaments and other exogenous substances: Secondary | ICD-10-CM

## 2023-01-01 ENCOUNTER — Other Ambulatory Visit: Payer: Self-pay | Admitting: Family Medicine

## 2023-01-01 DIAGNOSIS — E119 Type 2 diabetes mellitus without complications: Secondary | ICD-10-CM

## 2023-02-02 DIAGNOSIS — H18593 Other hereditary corneal dystrophies, bilateral: Secondary | ICD-10-CM | POA: Diagnosis not present

## 2023-02-02 DIAGNOSIS — H18513 Endothelial corneal dystrophy, bilateral: Secondary | ICD-10-CM | POA: Diagnosis not present

## 2023-02-22 ENCOUNTER — Telehealth: Payer: Self-pay

## 2023-02-22 NOTE — Telephone Encounter (Signed)
Copied from CRM [id number]. Topic: Clinical - Medication Question >> Feb 22, 2023  2:51 PM Nila Nephew wrote: Reason for CRM: Patient calling to state that insurance now requires a PA for her Monjaoru. Patient requesting that be initiated.

## 2023-02-26 ENCOUNTER — Other Ambulatory Visit: Payer: Self-pay | Admitting: Family Medicine

## 2023-02-26 DIAGNOSIS — E119 Type 2 diabetes mellitus without complications: Secondary | ICD-10-CM

## 2023-02-28 ENCOUNTER — Other Ambulatory Visit: Payer: Self-pay | Admitting: Family Medicine

## 2023-03-06 DIAGNOSIS — H18513 Endothelial corneal dystrophy, bilateral: Secondary | ICD-10-CM | POA: Diagnosis not present

## 2023-03-06 DIAGNOSIS — H18523 Epithelial (juvenile) corneal dystrophy, bilateral: Secondary | ICD-10-CM | POA: Diagnosis not present

## 2023-03-07 ENCOUNTER — Encounter: Payer: Self-pay | Admitting: Family Medicine

## 2023-03-07 ENCOUNTER — Ambulatory Visit (INDEPENDENT_AMBULATORY_CARE_PROVIDER_SITE_OTHER): Payer: Medicare HMO | Admitting: Family Medicine

## 2023-03-07 VITALS — BP 120/58 | HR 73 | Ht 67.0 in | Wt 223.0 lb

## 2023-03-07 DIAGNOSIS — E119 Type 2 diabetes mellitus without complications: Secondary | ICD-10-CM

## 2023-03-07 DIAGNOSIS — E032 Hypothyroidism due to medicaments and other exogenous substances: Secondary | ICD-10-CM | POA: Diagnosis not present

## 2023-03-07 DIAGNOSIS — G4733 Obstructive sleep apnea (adult) (pediatric): Secondary | ICD-10-CM | POA: Diagnosis not present

## 2023-03-07 DIAGNOSIS — F320 Major depressive disorder, single episode, mild: Secondary | ICD-10-CM | POA: Diagnosis not present

## 2023-03-07 LAB — POCT GLYCOSYLATED HEMOGLOBIN (HGB A1C): Hemoglobin A1C: 5.6 % (ref 4.0–5.6)

## 2023-03-07 MED ORDER — TIRZEPATIDE 5 MG/0.5ML ~~LOC~~ SOAJ: 5.0000 mg | SUBCUTANEOUS | 0 refills | Status: DC

## 2023-03-07 NOTE — Assessment & Plan Note (Signed)
 A1C looks awesome Stop metformin .  F/U  in 3-4 mo Increase Mounjaro to 7 Down 13 lbs Work on starting to exercise

## 2023-03-07 NOTE — Assessment & Plan Note (Addendum)
 Over Due to recheck TSH.   Lab Results  Component Value Date   TSH 0.18 (L) 03/22/2022

## 2023-03-07 NOTE — Assessment & Plan Note (Signed)
 Would like to continue Cymbalta  Work on exercise

## 2023-03-07 NOTE — Progress Notes (Signed)
   Established Patient Office Visit  Subjective  Patient ID: Penny Bauer, female    DOB: 09-10-1950  Age: 73 y.o. MRN: [id number]  Chief Complaint  Patient presents with   Diabetes    HPI      ROS    Objective:     BP (!) 120/58   Pulse 73   Ht 5' 7 (1.702 m)   Wt 223 lb (101.2 kg)   SpO2 94%   BMI 34.93 kg/m    Physical Exam Vitals and nursing note reviewed.  Constitutional:      Appearance: Normal appearance.  HENT:     Head: Normocephalic and atraumatic.  Eyes:     Conjunctiva/sclera: Conjunctivae normal.  Cardiovascular:     Rate and Rhythm: Normal rate and regular rhythm.  Pulmonary:     Effort: Pulmonary effort is normal.     Breath sounds: Normal breath sounds.  Skin:    General: Skin is warm and dry.  Neurological:     Mental Status: She is alert.  Psychiatric:        Mood and Affect: Mood normal.      Results for orders placed or performed in visit on 03/07/23  POCT HgB A1C  Result Value Ref Range   Hemoglobin A1C 5.6 4.0 - 5.6 %   HbA1c POC (<> result, manual entry)     HbA1c, POC (prediabetic range)     HbA1c, POC (controlled diabetic range)        The 10-year ASCVD risk score (Arnett DK, et al., 2019) is: 29.7%    Assessment & Plan:   Problem List Items Addressed This Visit       Respiratory   OSA (obstructive sleep apnea)   Didn't tolerate oral appliance doing well on Mounjaro .         Endocrine   Iatrogenic hypothyroidism   Over Due to recheck TSH.   Lab Results  Component Value Date   TSH 0.18 (L) 03/22/2022          Relevant Orders   TSH   Controlled type 2 diabetes mellitus without complication, without long-term current use of insulin (HCC) - Primary   A1C looks awesome Stop metformin .  F/U  in 3-4 mo Increase Mounjaro  to 7 Down 13 lbs Work on starting to exercise      Relevant Medications   tirzepatide  (MOUNJARO ) 5 MG/0.5ML Pen   Other Relevant Orders   POCT HgB A1C (Completed)   TSH    CMP14+EGFR     Other   Depression, major, single episode, mild (HCC)   Would like to continue Cymbalta  Work on exercise       Return in about 3 months (around 06/04/2023) for Diabetes follow-up, Hypertension.    Dorothyann Byars, MD

## 2023-03-07 NOTE — Assessment & Plan Note (Signed)
 Didn't tolerate oral appliance doing well on Mounjaro.

## 2023-03-08 LAB — CMP14+EGFR
ALT: 15 [IU]/L (ref 0–32)
AST: 11 [IU]/L (ref 0–40)
Albumin: 4.1 g/dL (ref 3.8–4.8)
Alkaline Phosphatase: 121 [IU]/L (ref 44–121)
BUN/Creatinine Ratio: 18 (ref 12–28)
BUN: 14 mg/dL (ref 8–27)
Bilirubin Total: 0.5 mg/dL (ref 0.0–1.2)
CO2: 26 mmol/L (ref 20–29)
Calcium: 9.8 mg/dL (ref 8.7–10.3)
Chloride: 100 mmol/L (ref 96–106)
Creatinine, Ser: 0.77 mg/dL (ref 0.57–1.00)
Globulin, Total: 3 g/dL (ref 1.5–4.5)
Glucose: 94 mg/dL (ref 70–99)
Potassium: 4.2 mmol/L (ref 3.5–5.2)
Sodium: 141 mmol/L (ref 134–144)
Total Protein: 7.1 g/dL (ref 6.0–8.5)
eGFR: 81 mL/min/{1.73_m2} (ref >59)

## 2023-03-08 LAB — TSH: TSH: 0.122 u[IU]/mL — ABNORMAL LOW (ref 0.450–4.500)

## 2023-03-09 ENCOUNTER — Encounter: Payer: Self-pay | Admitting: Family Medicine

## 2023-03-09 NOTE — Progress Notes (Signed)
 Hi Penny Bauer, TSH is still just a little bit overly suppressed.  So I do want to just back off on your thyroid  medication just slightly please just verify with me how you are taking it I think you are taking a whole tab every day but I just want to make sure.  If you are taking a whole tab every day then I am going to have you take half a tab on Sundays and a whole tab the other 6 days a week and then repeat that pattern each week.  We can then recheck your TSH in about 6 weeks.  Metabolic panel looks great.

## 2023-03-10 ENCOUNTER — Other Ambulatory Visit: Payer: Self-pay | Admitting: Family Medicine

## 2023-03-10 DIAGNOSIS — M2550 Pain in unspecified joint: Secondary | ICD-10-CM

## 2023-03-10 DIAGNOSIS — M791 Myalgia, unspecified site: Secondary | ICD-10-CM

## 2023-03-14 ENCOUNTER — Telehealth: Payer: Self-pay

## 2023-03-14 NOTE — Telephone Encounter (Signed)
Task completed.   Prior auth for: [facility] 5 MG Determination: APPROVED Auth #: [id number] Valid from: 01/31/2023 to 01/30/2024 Patient notified via MyChart

## 2023-03-14 NOTE — Telephone Encounter (Signed)
Prior auth for: [facility] 5 MG Determination: APPROVED Auth #: [id number] Valid from: 01/31/2023 to 01/30/2024 Patient notified via MyChart

## 2023-03-15 ENCOUNTER — Other Ambulatory Visit: Payer: Self-pay | Admitting: Family Medicine

## 2023-06-04 ENCOUNTER — Ambulatory Visit (INDEPENDENT_AMBULATORY_CARE_PROVIDER_SITE_OTHER): Payer: Medicare HMO | Admitting: Family Medicine

## 2023-06-04 ENCOUNTER — Encounter: Payer: Self-pay | Admitting: Family Medicine

## 2023-06-04 VITALS — BP 123/58 | HR 65 | Ht 67.0 in | Wt 218.0 lb

## 2023-06-04 DIAGNOSIS — E032 Hypothyroidism due to medicaments and other exogenous substances: Secondary | ICD-10-CM | POA: Diagnosis not present

## 2023-06-04 DIAGNOSIS — E66811 Obesity, class 1: Secondary | ICD-10-CM

## 2023-06-04 DIAGNOSIS — E119 Type 2 diabetes mellitus without complications: Secondary | ICD-10-CM | POA: Diagnosis not present

## 2023-06-04 DIAGNOSIS — Z6834 Body mass index (BMI) 34.0-34.9, adult: Secondary | ICD-10-CM | POA: Diagnosis not present

## 2023-06-04 LAB — POCT GLYCOSYLATED HEMOGLOBIN (HGB A1C): Hemoglobin A1C: 5.4 % (ref 4.0–5.6)

## 2023-06-04 MED ORDER — TIRZEPATIDE 7.5 MG/0.5ML ~~LOC~~ SOAJ
7.5000 mg | SUBCUTANEOUS | 1 refills | Status: DC
Start: 2023-06-04 — End: 2023-10-11

## 2023-06-04 NOTE — Assessment & Plan Note (Signed)
 BMI is now down to 34.  She is really doing fantastic.  Just reminded her to make sure that she is getting adequate protein and to really work on increased activity level she says she has been getting outside with her dogs a little bit more.  We discussed option of going up on the Mounjaro to 7.5 mg also want her to monitor her blood pressures at home I am wondering if they might be dropping occasionally and we might need to start adjusting her blood pressure medication.

## 2023-06-04 NOTE — Assessment & Plan Note (Addendum)
 Takes 1/2 tab 2 days per week and whole tab the other 5 days per week.    To recheck TSH level.  As she is losing weight we may have to decrease her dose.

## 2023-06-04 NOTE — Assessment & Plan Note (Addendum)
 A1C looks great today at 5.4, off of metformin . she is down another 5 lbs.  Will inc Mounjaro to 7.5mg 

## 2023-06-04 NOTE — Patient Instructions (Addendum)
 Check your BP a couple of times this week and next week and see if you BPs are on the low. End.   Could try decreasing your hydrochlorothiazide  alternating a whole and a half a tab every other day.

## 2023-06-04 NOTE — Progress Notes (Signed)
 Established Patient Office Visit  Subjective  Patient ID: Penny Bauer, female    DOB: [birth year]  Age: 73 y.o. MRN: [id number]  Chief Complaint  Patient presents with   Diabetes    HPI  Diabetes - no hypoglycemic events. No wounds or sores that are not healing well. No increased thirst or urination. Checking glucose at home. Taking medications as prescribed without any side effects. Doing well off the metformin .  She had plateaued with her weight for a little while but that seems to have started to catch back up and she is doing better.  She also says that occasionally she will have these episodes where she gets ready to stand up especially she has been sitting for a while and feel like a heat warming sensation, up her legs and then they feel really weak.  If she just stands or waits a few minutes it comes down and is feels like it is going back down her legs.       ROS    Objective:     BP (!) 123/58   Pulse 65   Ht 5\' 7"  (1.702 m)   Wt 218 lb (98.9 kg)   SpO2 96%   BMI 34.14 kg/m    Physical Exam Vitals and nursing note reviewed.  Constitutional:      Appearance: Normal appearance.  HENT:     Head: Normocephalic and atraumatic.  Eyes:     Conjunctiva/sclera: Conjunctivae normal.  Cardiovascular:     Rate and Rhythm: Normal rate and regular rhythm.  Pulmonary:     Effort: Pulmonary effort is normal.     Breath sounds: Normal breath sounds.  Skin:    General: Skin is warm and dry.  Neurological:     Mental Status: She is alert.  Psychiatric:        Mood and Affect: Mood normal.      Results for orders placed or performed in visit on 06/04/23  POCT HgB A1C  Result Value Ref Range   Hemoglobin A1C 5.4 4.0 - 5.6 %   HbA1c POC (<> result, manual entry)     HbA1c, POC (prediabetic range)     HbA1c, POC (controlled diabetic range)        The 10-year ASCVD risk score (Arnett DK, et al., 2019) is: 34%    Assessment & Plan:   Problem List Items  Addressed This Visit       Endocrine   Iatrogenic hypothyroidism   Takes 1/2 tab 2 days per week and whole tab the other 5 days per week.    To recheck TSH level.  As she is losing weight we may have to decrease her dose.      Relevant Orders   TSH   CMP14+EGFR   Lipid panel   Controlled type 2 diabetes mellitus without complication, without long-term current use of insulin (HCC) - Primary   A1C looks great today at 5.4, off of metformin . she is down another 5 lbs.  Will inc Mounjaro to 7.5mg        Relevant Medications   tirzepatide (MOUNJARO) 7.5 MG/0.5ML Pen   Other Relevant Orders   POCT HgB A1C (Completed)   TSH   CMP14+EGFR   Lipid panel     Other   Class 1 obesity without serious comorbidity with body mass index (BMI) of 34.0 to 34.9 in adult   BMI is now down to 34.  She is really doing fantastic.  Just reminded her to  make sure that she is getting adequate protein and to really work on increased activity level she says she has been getting outside with her dogs a little bit more.  We discussed option of going up on the Mounjaro to 7.5 mg also want her to monitor her blood pressures at home I am wondering if they might be dropping occasionally and we might need to start adjusting her blood pressure medication.      Relevant Medications   tirzepatide (MOUNJARO) 7.5 MG/0.5ML Pen   Did encourage her to check her blood pressures at home I wonder if the sensation if she is getting as may be her blood pressure dropping a little bit she also notes she occasionally feels lightheaded when she first gets out of bed in the morning she usually just goes slowly.  Return in about 4 months (around 10/05/2023) for Hypertension, Diabetes follow-up.    Duaine German, MD

## 2023-06-05 ENCOUNTER — Encounter: Payer: Self-pay | Admitting: Family Medicine

## 2023-06-05 LAB — CMP14+EGFR
ALT: 14 IU/L (ref 0–32)
AST: 14 IU/L (ref 0–40)
Albumin: 4.2 g/dL (ref 3.8–4.8)
Alkaline Phosphatase: 126 IU/L — ABNORMAL HIGH (ref 44–121)
BUN/Creatinine Ratio: 29 — ABNORMAL HIGH (ref 12–28)
BUN: 22 mg/dL (ref 8–27)
Bilirubin Total: 0.7 mg/dL (ref 0.0–1.2)
CO2: 25 mmol/L (ref 20–29)
Calcium: 9.6 mg/dL (ref 8.7–10.3)
Chloride: 99 mmol/L (ref 96–106)
Creatinine, Ser: 0.76 mg/dL (ref 0.57–1.00)
Globulin, Total: 3 g/dL (ref 1.5–4.5)
Glucose: 99 mg/dL (ref 70–99)
Potassium: 4.3 mmol/L (ref 3.5–5.2)
Sodium: 139 mmol/L (ref 134–144)
Total Protein: 7.2 g/dL (ref 6.0–8.5)
eGFR: 82 mL/min/{1.73_m2} (ref >59)

## 2023-06-05 LAB — TSH: TSH: 0.718 u[IU]/mL (ref 0.450–4.500)

## 2023-06-05 LAB — LIPID PANEL
Chol/HDL Ratio: 3.2 ratio (ref 0.0–4.4)
Cholesterol, Total: 148 mg/dL (ref 100–199)
HDL: 46 mg/dL (ref >39)
LDL Chol Calc (NIH): 85 mg/dL (ref 0–99)
Triglycerides: 90 mg/dL (ref 0–149)
VLDL Cholesterol Cal: 17 mg/dL (ref 5–40)

## 2023-06-05 NOTE — Progress Notes (Signed)
 Hi Elenore, metabolic panel overall looks good your alkaline phosphatase is up just by couple of points.  It was normal 3 months ago.  Will continue to monitor that no changes needed currently just continue to work on healthy diet and increased activity level.  The TSH actually looks better this time.  So just going to monitor and continue with your current regimen.  Your cholesterol looks great.  Just encourage you to schedule your Medicare wellness call with Shelvy Dickens our Medicare wellness nurse.  It can be done via telephone call if you would like.  And also just encourage you to schedule your mammogram when you get a chance.

## 2023-07-12 DIAGNOSIS — Z1231 Encounter for screening mammogram for malignant neoplasm of breast: Secondary | ICD-10-CM | POA: Diagnosis not present

## 2023-07-12 DIAGNOSIS — R92313 Mammographic fatty tissue density, bilateral breasts: Secondary | ICD-10-CM | POA: Diagnosis not present

## 2023-08-28 ENCOUNTER — Other Ambulatory Visit: Payer: Self-pay | Admitting: Family Medicine

## 2023-08-31 DIAGNOSIS — H26492 Other secondary cataract, left eye: Secondary | ICD-10-CM | POA: Diagnosis not present

## 2023-08-31 DIAGNOSIS — E119 Type 2 diabetes mellitus without complications: Secondary | ICD-10-CM | POA: Diagnosis not present

## 2023-08-31 DIAGNOSIS — H18593 Other hereditary corneal dystrophies, bilateral: Secondary | ICD-10-CM | POA: Diagnosis not present

## 2023-08-31 DIAGNOSIS — H52223 Regular astigmatism, bilateral: Secondary | ICD-10-CM | POA: Diagnosis not present

## 2023-08-31 DIAGNOSIS — H524 Presbyopia: Secondary | ICD-10-CM | POA: Diagnosis not present

## 2023-08-31 DIAGNOSIS — H5203 Hypermetropia, bilateral: Secondary | ICD-10-CM | POA: Diagnosis not present

## 2023-08-31 DIAGNOSIS — H18513 Endothelial corneal dystrophy, bilateral: Secondary | ICD-10-CM | POA: Diagnosis not present

## 2023-08-31 LAB — OPHTHALMOLOGY REPORT-SCANNED

## 2023-10-09 ENCOUNTER — Ambulatory Visit: Admitting: Family Medicine

## 2023-10-11 ENCOUNTER — Encounter: Payer: Self-pay | Admitting: Family Medicine

## 2023-10-11 ENCOUNTER — Ambulatory Visit: Admitting: Family Medicine

## 2023-10-11 VITALS — BP 110/43 | HR 71 | Ht 67.0 in | Wt 210.0 lb

## 2023-10-11 DIAGNOSIS — E1169 Type 2 diabetes mellitus with other specified complication: Secondary | ICD-10-CM

## 2023-10-11 DIAGNOSIS — Z23 Encounter for immunization: Secondary | ICD-10-CM

## 2023-10-11 DIAGNOSIS — E785 Hyperlipidemia, unspecified: Secondary | ICD-10-CM | POA: Diagnosis not present

## 2023-10-11 DIAGNOSIS — E119 Type 2 diabetes mellitus without complications: Secondary | ICD-10-CM

## 2023-10-11 DIAGNOSIS — Z7985 Long-term (current) use of injectable non-insulin antidiabetic drugs: Secondary | ICD-10-CM

## 2023-10-11 LAB — POCT UA - MICROALBUMIN
Albumin/Creatinine Ratio, Urine, POC: <30
Creatinine, POC: 100 mg/dL
Microalbumin Ur, POC: 30 mg/L

## 2023-10-11 LAB — POCT GLYCOSYLATED HEMOGLOBIN (HGB A1C): Hemoglobin A1C: 5.7 % — AB (ref 4.0–5.6)

## 2023-10-11 MED ORDER — TIRZEPATIDE 10 MG/0.5ML ~~LOC~~ SOAJ: 10.0000 mg | SUBCUTANEOUS | 0 refills | Status: DC

## 2023-10-11 MED ORDER — OXYBUTYNIN CHLORIDE 5 MG PO TABS: 5.0000 mg | ORAL_TABLET | Freq: Two times a day (BID) | ORAL | 3 refills | Status: AC

## 2023-10-11 NOTE — Addendum Note (Signed)
 Addended by: FREYA BASCOM CROME on: 10/11/2023 05:24 PM   Modules accepted: Orders

## 2023-10-11 NOTE — Assessment & Plan Note (Signed)
 Type 2 diabetes mellitus A1c increased to 5.6%. - Increase medication dose to 10 mg, pending insurance approval for early refill. - Encourage dietary modifications: increase protein intake, consume smaller meals, prioritize vegetables. - Advise increasing water intake.

## 2023-10-11 NOTE — Progress Notes (Signed)
 Established Patient Office Visit  Subjective  Patient ID: Penny Bauer, female    DOB: 12-May-1950  Age: 73 y.o. MRN: [id number]  Chief Complaint  Patient presents with   Diabetes    HPI Discussed the use of AI scribe software for clinical note transcription with the patient, who gave verbal consent to proceed.  History of Present Illness Penny Bauer is a 73 year old female with type 2 diabetes and hypertension who presents for medication management and follow-up.  Glycemic control and appetite changes - Increase in hemoglobin A1c from 5.4% to a slightly higher value - Increased hunger, particularly in the second half of the day - Increased consumption of sweet foods and purchase of foods typically avoided - Concern about further rise in A1c - Currently taking medication for type 2 diabetes  Hypertension and antihypertensive therapy - Currently taking hydrochlorothiazide  for blood pressure control - Home blood pressure readings are elevated - Considers weaning off hydrochlorothiazide  and is open to trying a reduced dose by splitting tablets - Suspects elevated blood pressure may be related to a cough  Mood disturbance and chronic pain - On Cymbalta  for mood and back pain management - Feels 'sad a lot,' attributing this to grief rather than a chemical imbalance - Cymbalta  provides relief for back pain - Prefers to maintain current Cymbalta  dosage for pain control  Thyroid  hormone replacement therapy - Thyroid  medication regimen involves taking a half dose on Wednesdays and Sundays, and a full dose on other days - Has a system in place to manage medication schedule  Obstructive sleep apnea and cpap intolerance - Uses CPAP machine for sleep apnea - Finds CPAP uncomfortable and often wakes up due to machine noise - Attempts to use CPAP as much as possible but sometimes skips nights - Has tried nasal pillows, but mouth breathing renders them ineffective  Preventive  health maintenance - Mammogram performed less than a year ago, possibly in the spring, with stable results - Eye exam performed about a month ago, with stable findings     ROS    Objective:     BP (!) 110/43   Pulse 71   Ht 5' 7 (1.702 m)   Wt 210 lb 0.6 oz (95.3 kg)   SpO2 95%   BMI 32.90 kg/m    Physical Exam Vitals and nursing note reviewed.  Constitutional:      Appearance: Normal appearance.  HENT:     Head: Normocephalic and atraumatic.  Eyes:     Conjunctiva/sclera: Conjunctivae normal.  Cardiovascular:     Rate and Rhythm: Normal rate and regular rhythm.  Pulmonary:     Effort: Pulmonary effort is normal.     Breath sounds: Normal breath sounds.  Skin:    General: Skin is warm and dry.  Neurological:     Mental Status: She is alert.  Psychiatric:        Mood and Affect: Mood normal.      Results for orders placed or performed in visit on 10/11/23  POCT HgB A1C  Result Value Ref Range   Hemoglobin A1C 5.7 (A) 4.0 - 5.6 %   HbA1c POC (<> result, manual entry)     HbA1c, POC (prediabetic range)     HbA1c, POC (controlled diabetic range)        The 10-year ASCVD risk score (Arnett DK, et al., 2019) is: 27.9%    Assessment & Plan:   Problem List Items Addressed This Visit  Endocrine   Hyperlipidemia associated with type 2 diabetes mellitus (HCC)   Continue daily statin.   Lab Results  Component Value Date   LDLCALC 85 06/04/2023         Relevant Medications   tirzepatide  (MOUNJARO ) 10 MG/0.5ML Pen   Controlled type 2 diabetes mellitus without complication, without long-term current use of insulin (HCC)   Type 2 diabetes mellitus A1c increased to 5.6%. - Increase medication dose to 10 mg, pending insurance approval for early refill. - Encourage dietary modifications: increase protein intake, consume smaller meals, prioritize vegetables. - Advise increasing water intake.      Relevant Medications   tirzepatide  (MOUNJARO ) 10  MG/0.5ML Pen   Other Relevant Orders   POCT HgB A1C (Completed)   Other Visit Diagnoses       Encounter for immunization    -  Primary   Relevant Orders   Flu vaccine HIGH DOSE PF(Fluzone Trivalent) (Completed)      Assessment and Plan Assessment & Plan  Obstructive sleep apnea Inconsistent CPAP use due to discomfort and noise. - Request download from [facility] to assess pressure needs. - Adjust CPAP settings to fixed pressure based on results. - Encourage CPAP use for at least 4 hours per night.  Hypertension Considering weaning off hydrochlorothiazide  due to low blood pressure. - Advise halving hydrochlorothiazide  dose, monitor blood pressure at home. - Consider discontinuing hydrochlorothiazide  if blood pressure remains low. - Bring home blood pressure monitor to clinic for comparison if needed.  Major depressive disorder and chronic back pain Cymbalta  provides pain relief, continue current dose. - Continue current dose of Cymbalta .  Hypothyroidism Thyroid  function well-managed with current regimen. - Continue current thyroid  medication regimen.  General Health Maintenance Flu shot received, recent eye exam stable, mammogram likely done in spring. - Confirm date of last mammogram and schedule if due. - Obtain urine sample for protein level assessment.   Return in about 4 months (around 02/12/2024) for Diabetes follow-up.    Penny Byars, MD

## 2023-10-11 NOTE — Assessment & Plan Note (Signed)
 Continue daily statin.   Lab Results  Component Value Date   LDLCALC 85 06/04/2023

## 2023-11-05 NOTE — Progress Notes (Signed)
 Penny Bauer                                          MRN: [id number]   11/05/2023   The [facility] Quality Team Specialist reviewed this patient medical record for the purposes of chart review for care gap closure. The following were reviewed: abstraction for care gap closure-kidney health evaluation for diabetes:eGFR  and uACR.    [facility] Quality Team

## 2023-12-19 ENCOUNTER — Other Ambulatory Visit: Payer: Self-pay | Admitting: Family Medicine

## 2023-12-23 ENCOUNTER — Other Ambulatory Visit: Payer: Self-pay | Admitting: Family Medicine

## 2023-12-23 DIAGNOSIS — G8929 Other chronic pain: Secondary | ICD-10-CM

## 2023-12-23 DIAGNOSIS — E032 Hypothyroidism due to medicaments and other exogenous substances: Secondary | ICD-10-CM

## 2024-01-05 ENCOUNTER — Other Ambulatory Visit: Payer: Self-pay | Admitting: Family Medicine

## 2024-01-05 DIAGNOSIS — M791 Myalgia, unspecified site: Secondary | ICD-10-CM

## 2024-01-05 DIAGNOSIS — M2550 Pain in unspecified joint: Secondary | ICD-10-CM

## 2024-01-09 ENCOUNTER — Other Ambulatory Visit: Payer: Self-pay | Admitting: Family Medicine

## 2024-01-09 DIAGNOSIS — E119 Type 2 diabetes mellitus without complications: Secondary | ICD-10-CM

## 2024-01-11 ENCOUNTER — Telehealth: Payer: Self-pay

## 2024-01-11 NOTE — Progress Notes (Signed)
° °  01/11/2024  Patient ID: Penny Bauer, female   DOB: 1950-11-11, 73 y.o.   MRN: [id number]  This patient is appearing on a report for being at risk of failing the adherence measure for cholesterol (statin) medications this calendar year.   Medication: atorvastatin  20mg  Last fill date: 12/23/23 for 90 day supply  Insurance report was not up to date. No action needed at this time.   Penny Bauer, PharmD, DPLA

## 2024-02-26 ENCOUNTER — Ambulatory Visit: Admitting: Family Medicine

## 2024-03-25 ENCOUNTER — Ambulatory Visit: Admitting: Family Medicine
